# Patient Record
Sex: Male | Born: 1980 | Race: Black or African American | Hispanic: No | Marital: Single | Smoking: Former smoker
Health system: Southern US, Community
[De-identification: ages and names within clinical notes are randomized; demographics above are authoritative.]

## PROBLEM LIST (undated history)

## (undated) DIAGNOSIS — K469 Unspecified abdominal hernia without obstruction or gangrene: Secondary | ICD-10-CM

## (undated) DIAGNOSIS — A599 Trichomoniasis, unspecified: Secondary | ICD-10-CM

## (undated) DIAGNOSIS — S42302A Unspecified fracture of shaft of humerus, left arm, initial encounter for closed fracture: Secondary | ICD-10-CM

## (undated) DIAGNOSIS — G43909 Migraine, unspecified, not intractable, without status migrainosus: Secondary | ICD-10-CM

## (undated) DIAGNOSIS — E785 Hyperlipidemia, unspecified: Secondary | ICD-10-CM

## (undated) HISTORY — PX: HERNIA REPAIR: SHX51

## (undated) HISTORY — PX: OTHER SURGICAL HISTORY: SHX169

---

## 2003-09-02 ENCOUNTER — Emergency Department (HOSPITAL_COMMUNITY): Admission: EM | Admit: 2003-09-02 | Discharge: 2003-09-02 | Payer: Self-pay | Admitting: Emergency Medicine

## 2004-06-03 ENCOUNTER — Emergency Department (HOSPITAL_COMMUNITY): Admission: EM | Admit: 2004-06-03 | Discharge: 2004-06-03 | Payer: Self-pay | Admitting: Emergency Medicine

## 2004-09-27 ENCOUNTER — Emergency Department (HOSPITAL_COMMUNITY): Admission: EM | Admit: 2004-09-27 | Discharge: 2004-09-27 | Payer: Self-pay | Admitting: Emergency Medicine

## 2005-05-04 ENCOUNTER — Emergency Department (HOSPITAL_COMMUNITY): Admission: EM | Admit: 2005-05-04 | Discharge: 2005-05-04 | Payer: Self-pay | Admitting: Emergency Medicine

## 2005-10-19 ENCOUNTER — Emergency Department (HOSPITAL_COMMUNITY): Admission: EM | Admit: 2005-10-19 | Discharge: 2005-10-19 | Payer: Self-pay | Admitting: *Deleted

## 2005-11-01 ENCOUNTER — Emergency Department (HOSPITAL_COMMUNITY): Admission: EM | Admit: 2005-11-01 | Discharge: 2005-11-01 | Payer: Self-pay | Admitting: Emergency Medicine

## 2006-03-13 ENCOUNTER — Emergency Department (HOSPITAL_COMMUNITY): Admission: EM | Admit: 2006-03-13 | Discharge: 2006-03-13 | Payer: Self-pay | Admitting: Emergency Medicine

## 2006-06-01 ENCOUNTER — Emergency Department (HOSPITAL_COMMUNITY): Admission: EM | Admit: 2006-06-01 | Discharge: 2006-06-01 | Payer: Self-pay | Admitting: Emergency Medicine

## 2007-05-17 ENCOUNTER — Emergency Department (HOSPITAL_COMMUNITY): Admission: EM | Admit: 2007-05-17 | Discharge: 2007-05-17 | Payer: Self-pay | Admitting: Emergency Medicine

## 2007-07-22 ENCOUNTER — Emergency Department (HOSPITAL_COMMUNITY): Admission: EM | Admit: 2007-07-22 | Discharge: 2007-07-22 | Payer: Self-pay | Admitting: Emergency Medicine

## 2007-09-18 ENCOUNTER — Emergency Department (HOSPITAL_COMMUNITY): Admission: EM | Admit: 2007-09-18 | Discharge: 2007-09-18 | Payer: Self-pay | Admitting: Emergency Medicine

## 2007-11-14 ENCOUNTER — Emergency Department (HOSPITAL_COMMUNITY): Admission: EM | Admit: 2007-11-14 | Discharge: 2007-11-14 | Payer: Self-pay | Admitting: Emergency Medicine

## 2007-11-20 ENCOUNTER — Emergency Department (HOSPITAL_COMMUNITY): Admission: EM | Admit: 2007-11-20 | Discharge: 2007-11-20 | Payer: Self-pay | Admitting: Emergency Medicine

## 2007-12-11 ENCOUNTER — Emergency Department (HOSPITAL_COMMUNITY): Admission: EM | Admit: 2007-12-11 | Discharge: 2007-12-11 | Payer: Self-pay | Admitting: Emergency Medicine

## 2008-02-11 ENCOUNTER — Emergency Department (HOSPITAL_COMMUNITY): Admission: EM | Admit: 2008-02-11 | Discharge: 2008-02-11 | Payer: Self-pay | Admitting: Emergency Medicine

## 2008-03-26 ENCOUNTER — Emergency Department (HOSPITAL_COMMUNITY): Admission: EM | Admit: 2008-03-26 | Discharge: 2008-03-26 | Payer: Self-pay | Admitting: Emergency Medicine

## 2008-05-13 ENCOUNTER — Emergency Department (HOSPITAL_COMMUNITY): Admission: EM | Admit: 2008-05-13 | Discharge: 2008-05-13 | Payer: Self-pay | Admitting: Emergency Medicine

## 2008-06-03 ENCOUNTER — Emergency Department (HOSPITAL_COMMUNITY): Admission: EM | Admit: 2008-06-03 | Discharge: 2008-06-03 | Payer: Self-pay | Admitting: Emergency Medicine

## 2008-09-07 ENCOUNTER — Emergency Department (HOSPITAL_COMMUNITY): Admission: EM | Admit: 2008-09-07 | Discharge: 2008-09-07 | Payer: Self-pay | Admitting: Emergency Medicine

## 2008-10-04 ENCOUNTER — Emergency Department (HOSPITAL_COMMUNITY): Admission: EM | Admit: 2008-10-04 | Discharge: 2008-10-04 | Payer: Self-pay | Admitting: Emergency Medicine

## 2008-10-24 ENCOUNTER — Emergency Department (HOSPITAL_COMMUNITY): Admission: EM | Admit: 2008-10-24 | Discharge: 2008-10-24 | Payer: Self-pay | Admitting: Emergency Medicine

## 2008-11-09 ENCOUNTER — Emergency Department (HOSPITAL_COMMUNITY): Admission: EM | Admit: 2008-11-09 | Discharge: 2008-11-09 | Payer: Self-pay | Admitting: Emergency Medicine

## 2008-11-11 ENCOUNTER — Emergency Department (HOSPITAL_COMMUNITY): Admission: EM | Admit: 2008-11-11 | Discharge: 2008-11-11 | Payer: Self-pay | Admitting: Emergency Medicine

## 2008-12-01 ENCOUNTER — Emergency Department (HOSPITAL_COMMUNITY): Admission: EM | Admit: 2008-12-01 | Discharge: 2008-12-02 | Payer: Self-pay | Admitting: Emergency Medicine

## 2009-02-19 ENCOUNTER — Emergency Department (HOSPITAL_COMMUNITY): Admission: EM | Admit: 2009-02-19 | Discharge: 2009-02-19 | Payer: Self-pay | Admitting: Emergency Medicine

## 2009-02-21 ENCOUNTER — Emergency Department (HOSPITAL_COMMUNITY): Admission: EM | Admit: 2009-02-21 | Discharge: 2009-02-21 | Payer: Self-pay | Admitting: Emergency Medicine

## 2009-04-11 ENCOUNTER — Emergency Department (HOSPITAL_COMMUNITY): Admission: EM | Admit: 2009-04-11 | Discharge: 2009-04-11 | Payer: Self-pay | Admitting: Emergency Medicine

## 2010-02-22 ENCOUNTER — Encounter: Payer: Self-pay | Admitting: Emergency Medicine

## 2010-02-25 ENCOUNTER — Emergency Department (HOSPITAL_COMMUNITY)
Admission: EM | Admit: 2010-02-25 | Discharge: 2010-02-25 | Payer: Self-pay | Source: Home / Self Care | Admitting: Emergency Medicine

## 2010-04-27 LAB — BASIC METABOLIC PANEL
Creatinine, Ser: 0.74 mg/dL (ref 0.4–1.5)
GFR calc Af Amer: >60 mL/min (ref >60)
Potassium: 3.7 mEq/L (ref 3.5–5.1)
Sodium: 139 mEq/L (ref 135–145)

## 2010-04-27 LAB — DIFFERENTIAL
Basophils Relative: 0 % (ref 0–1)
Neutrophils Relative %: 75 % (ref 43–77)

## 2010-04-27 LAB — CULTURE, ROUTINE-ABSCESS

## 2010-04-27 LAB — CBC
Hemoglobin: 14.6 g/dL (ref 13.0–17.0)
MCHC: 34.4 g/dL (ref 30.0–36.0)
RBC: 4.71 MIL/uL (ref 4.22–5.81)
WBC: 9.4 10*3/uL (ref 4.0–10.5)

## 2010-05-08 LAB — DIFFERENTIAL
Basophils Absolute: 0 10*3/uL (ref 0.0–0.1)
Basophils Relative: 1 % (ref 0–1)
Eosinophils Relative: 1 % (ref 0–5)
Lymphocytes Relative: 48 % — ABNORMAL HIGH (ref 12–46)
Lymphs Abs: 3.1 10*3/uL (ref 0.7–4.0)
Monocytes Relative: 5 % (ref 3–12)
Neutro Abs: 2.9 10*3/uL (ref 1.7–7.7)
Neutrophils Relative %: 45 % (ref 43–77)

## 2010-05-08 LAB — RAPID URINE DRUG SCREEN, HOSP PERFORMED
Barbiturates: NOT DETECTED
Benzodiazepines: NOT DETECTED
Opiates: NOT DETECTED

## 2010-05-08 LAB — BASIC METABOLIC PANEL
Calcium: 9.9 mg/dL (ref 8.4–10.5)
Chloride: 110 mEq/L (ref 96–112)
GFR calc Af Amer: >60 mL/min (ref >60)
Glucose, Bld: 111 mg/dL — ABNORMAL HIGH (ref 70–99)
Potassium: 2.8 mEq/L — ABNORMAL LOW (ref 3.5–5.1)
Sodium: 143 mEq/L (ref 135–145)

## 2010-05-08 LAB — ETHANOL: Alcohol, Ethyl (B): 185 mg/dL — ABNORMAL HIGH (ref 0–10)

## 2010-05-08 LAB — CBC
MCHC: 34.4 g/dL (ref 30.0–36.0)
RDW: 13.9 % (ref 11.5–15.5)

## 2010-05-20 LAB — URINALYSIS, ROUTINE W REFLEX MICROSCOPIC
Glucose, UA: NEGATIVE mg/dL
Hgb urine dipstick: NEGATIVE
Ketones, ur: NEGATIVE mg/dL
Nitrite: NEGATIVE
Urobilinogen, UA: 1 mg/dL (ref 0.0–1.0)
pH: 6.5 (ref 5.0–8.0)

## 2010-10-30 LAB — CBC
HCT: 43.3
Hemoglobin: 14.9
MCHC: 34.5
Platelets: 263
RDW: 14.5

## 2010-10-30 LAB — ETHANOL: Alcohol, Ethyl (B): <5

## 2010-10-30 LAB — BASIC METABOLIC PANEL
BUN: 10
CO2: 28
Calcium: 8.9
Glucose, Bld: 100 — ABNORMAL HIGH

## 2010-10-30 LAB — RAPID URINE DRUG SCREEN, HOSP PERFORMED
Benzodiazepines: POSITIVE — AB
Cocaine: NOT DETECTED
Opiates: NOT DETECTED
Tetrahydrocannabinol: POSITIVE — AB

## 2010-10-30 LAB — URINALYSIS, ROUTINE W REFLEX MICROSCOPIC
Glucose, UA: NEGATIVE
Protein, ur: 100 — AB
Specific Gravity, Urine: >1.03 — ABNORMAL HIGH

## 2010-10-30 LAB — DIFFERENTIAL
Eosinophils Relative: 2
Lymphocytes Relative: 36
Neutro Abs: 3.2

## 2010-10-30 LAB — URINE MICROSCOPIC-ADD ON

## 2010-11-04 LAB — BASIC METABOLIC PANEL
BUN: 7
Calcium: 9.6
Chloride: 103
GFR calc Af Amer: >60
Potassium: 3.6

## 2010-11-04 LAB — DIFFERENTIAL
Basophils Absolute: 0
Basophils Relative: 0
Neutro Abs: 5.1
Neutrophils Relative %: 67

## 2010-11-04 LAB — STREP A DNA PROBE

## 2010-11-04 LAB — CBC
MCHC: 33.6
Platelets: 290
RDW: 12.7

## 2011-03-11 ENCOUNTER — Emergency Department (HOSPITAL_COMMUNITY): Payer: Self-pay

## 2011-03-11 ENCOUNTER — Encounter (HOSPITAL_COMMUNITY): Payer: Self-pay | Admitting: *Deleted

## 2011-03-11 ENCOUNTER — Emergency Department (HOSPITAL_COMMUNITY)
Admission: EM | Admit: 2011-03-11 | Discharge: 2011-03-11 | Disposition: A | Discharge status: Home / Self Care | Payer: Self-pay | Attending: Emergency Medicine | Admitting: Emergency Medicine

## 2011-03-11 DIAGNOSIS — F17200 Nicotine dependence, unspecified, uncomplicated: Secondary | ICD-10-CM | POA: Insufficient documentation

## 2011-03-11 DIAGNOSIS — X58XXXA Exposure to other specified factors, initial encounter: Secondary | ICD-10-CM | POA: Insufficient documentation

## 2011-03-11 DIAGNOSIS — S39012A Strain of muscle, fascia and tendon of lower back, initial encounter: Secondary | ICD-10-CM

## 2011-03-11 DIAGNOSIS — S335XXA Sprain of ligaments of lumbar spine, initial encounter: Secondary | ICD-10-CM | POA: Insufficient documentation

## 2011-03-11 DIAGNOSIS — Y9210 Unspecified residential institution as the place of occurrence of the external cause: Secondary | ICD-10-CM | POA: Insufficient documentation

## 2011-03-11 MED ORDER — HYDROCODONE-ACETAMINOPHEN 5-325 MG PO TABS
2.0000 | ORAL_TABLET | Freq: Once | ORAL | Status: AC
  Administered 2011-03-11: 2 via ORAL
  Filled 2011-03-11 (×2): qty 1

## 2011-03-11 MED ORDER — METHOCARBAMOL 500 MG PO TABS: ORAL_TABLET | ORAL | Status: DC

## 2011-03-11 MED ORDER — HYDROCODONE-ACETAMINOPHEN 5-325 MG PO TABS: 1.0000 | ORAL_TABLET | ORAL | Status: AC | PRN

## 2011-03-11 MED ORDER — ONDANSETRON HCL 4 MG PO TABS
4.0000 mg | ORAL_TABLET | Freq: Once | ORAL | Status: AC
  Administered 2011-03-11: 4 mg via ORAL
  Filled 2011-03-11: qty 1

## 2011-03-11 MED ORDER — DIAZEPAM 5 MG PO TABS
5.0000 mg | ORAL_TABLET | Freq: Once | ORAL | Status: AC
  Administered 2011-03-11: 5 mg via ORAL
  Filled 2011-03-11: qty 1

## 2011-03-11 NOTE — ED Notes (Signed)
Pt c/o lower back pain x 2 weeks but started shooting up to his neck if he bends over since Sunday. Denies injury.

## 2011-03-11 NOTE — ED Provider Notes (Signed)
History     CSN: [id number]  Arrival date & time 03/11/11  0812   First MD Initiated Contact with Patient 03/11/11 1117      Chief Complaint  Patient presents with  . Back Pain    (Consider location/radiation/quality/duration/timing/severity/associated sxs/prior treatment) HPI Comments: Pt sustained an injury to the lower back while in jail several months ago. 2 weeks ago he started having more pain, and now has pain from the back to the shoulders.  Patient is a 31 y.o. male presenting with back pain. The history is provided by the patient.  Back Pain  Pertinent negatives include no chest pain, no abdominal pain and no dysuria.    History reviewed. No pertinent past medical history.  Past Surgical History  Procedure Date  . Hernia repair     Family History  Problem Relation Age of Onset  . Hypertension Father   . Diabetes Father   . Hypertension Sister   . Diabetes Sister     History  Substance Use Topics  . Smoking status: Current Everyday Smoker    Types: Cigarettes  . Smokeless tobacco: Not on file  . Alcohol Use: Yes     occasionally      Review of Systems  Constitutional: Negative for activity change.       All ROS Neg except as noted in HPI  HENT: Negative for nosebleeds and neck pain.   Eyes: Negative for photophobia and discharge.  Respiratory: Negative for cough, shortness of breath and wheezing.   Cardiovascular: Negative for chest pain and palpitations.  Gastrointestinal: Negative for abdominal pain and blood in stool.  Genitourinary: Negative for dysuria, frequency and hematuria.  Musculoskeletal: Positive for back pain. Negative for arthralgias.  Skin: Negative.   Neurological: Negative for dizziness, seizures and speech difficulty.  Psychiatric/Behavioral: Negative for hallucinations and confusion.    Allergies  Penicillins; Bee venom; and Cranberry  Home Medications   Current Outpatient Rx  Name Route Sig Dispense Refill  .  HYDROCODONE-ACETAMINOPHEN 5-500 MG PO TABS Oral Take 1 tablet by mouth once. Patient said he took one of his dads tablets to try and help his pain      BP 133/71  Pulse 69  Temp(Src) 97.6 F (36.4 C) (Oral)  Resp 16  Ht 5\' 7"  (1.702 m)  Wt 228 lb 3 oz (103.505 kg)  BMI 35.74 kg/m2  SpO2 100%  Physical Exam  Nursing note and vitals reviewed. Constitutional: He is oriented to person, place, and time. He appears well-developed and well-nourished.  Non-toxic appearance.  HENT:  Head: Normocephalic.  Right Ear: Tympanic membrane and external ear normal.  Left Ear: Tympanic membrane and external ear normal.  Eyes: EOM and lids are normal. Pupils are equal, round, and reactive to light.  Neck: Normal range of motion. Neck supple. Carotid bruit is not present.  Cardiovascular: Normal rate, regular rhythm, normal heart sounds, intact distal pulses and normal pulses.   Pulmonary/Chest: Breath sounds normal. No respiratory distress.  Abdominal: Soft. Bowel sounds are normal. There is no tenderness. There is no guarding.  Musculoskeletal: Normal range of motion.       Lumbar area pain to palpation and ROM.  Lymphadenopathy:       Head (right side): No submandibular adenopathy present.       Head (left side): No submandibular adenopathy present.    He has no cervical adenopathy.  Neurological: He is alert and oriented to person, place, and time. He has normal strength. No cranial nerve  deficit or sensory deficit.       Gait wnl.  Skin: Skin is warm and dry.  Psychiatric: He has a normal mood and affect. His speech is normal.    ED Course  Procedures (including critical care time)  Labs Reviewed - No data to display Dg Lumbar Spine Complete  03/11/2011  *RADIOLOGY REPORT*  Clinical Data: Low back pain.  LUMBAR SPINE - COMPLETE 4+ VIEW  Comparison: None.  Findings: Five lumbar type vertebral bodies.  Vertebral body height is preserved.  The alignment is within normal limits.  Slight  straightening of the normal cervical lordosis on the lateral view. Intervertebral disc spaces are preserved.  The no pars defects are present.  IMPRESSION: No acute osseous abnormality.  Straightening of the lumbar lordosis may be secondary to spasm.  Original Report Authenticated By: Andreas Newport, M.D.     No diagnosis found.    MDM  I have reviewed nursing notes, vital signs, and all appropriate lab and imaging results for this patient.        Kathie Dike, [place] 03/11/11 2226

## 2011-03-12 NOTE — ED Provider Notes (Signed)
Medical screening examination/treatment/procedure(s) were performed by non-physician practitioner and as supervising physician I was immediately available for consultation/collaboration.   Laray Anger, DO 03/12/11 1049

## 2011-04-12 ENCOUNTER — Emergency Department (HOSPITAL_COMMUNITY)
Admission: EM | Admit: 2011-04-12 | Discharge: 2011-04-12 | Disposition: A | Discharge status: Home Health | Payer: Self-pay | Attending: Emergency Medicine | Admitting: Emergency Medicine

## 2011-04-12 ENCOUNTER — Encounter (HOSPITAL_COMMUNITY): Payer: Self-pay | Admitting: *Deleted

## 2011-04-12 DIAGNOSIS — F17200 Nicotine dependence, unspecified, uncomplicated: Secondary | ICD-10-CM | POA: Insufficient documentation

## 2011-04-12 DIAGNOSIS — K649 Unspecified hemorrhoids: Secondary | ICD-10-CM | POA: Insufficient documentation

## 2011-04-12 MED ORDER — BISACODYL 5 MG PO TBEC: 5.0000 mg | DELAYED_RELEASE_TABLET | Freq: Two times a day (BID) | ORAL | Status: AC

## 2011-04-12 MED ORDER — HYDROCORTISONE ACETATE 25 MG RE SUPP: 25.0000 mg | Freq: Two times a day (BID) | RECTAL | Status: AC

## 2011-04-12 NOTE — Discharge Instructions (Signed)
Please soak in a double warm salt water 2 times daily for approximately 15 minutes each time. After the soak please use Anusol suppositories. Please use Dulcolax to soften the stool. Please increase fluids and fiber in your diet.High Fiber Diet A high fiber diet changes your normal diet to include more whole grains, legumes, fruits, and vegetables. Changes in the diet involve replacing refined carbohydrates with unrefined foods. The calorie level of the diet is essentially unchanged. The Dietary Reference Intake (recommended amount) for adult males is 38 g per day. For adult females, it is 25 g per day. Pregnant and lactating women should consume 28 g of fiber per day. Fiber is the intact part of a plant that is not broken down during digestion. Functional fiber is fiber that has been isolated from the plant to provide a beneficial effect in the body. PURPOSE  Increase stool bulk.   Ease and regulate bowel movements.   Lower cholesterol.  INDICATIONS THAT YOU NEED MORE FIBER  Constipation and hemorrhoids.   Uncomplicated diverticulosis (intestine condition) and irritable bowel syndrome.   Weight management.   As a protective measure against hardening of the arteries (atherosclerosis), diabetes, and cancer.  NOTE OF CAUTION If you have a digestive or bowel problem, ask your caregiver for advice before adding high fiber foods to your diet. Some of the following medical problems are such that a high fiber diet should not be used without consulting your caregiver:  Acute diverticulitis (intestine infection).   Partial small bowel obstructions.   Complicated diverticular disease involving bleeding, rupture (perforation), or abscess (boil, furuncle).   Presence of autonomic neuropathy (nerve damage) or gastric paresis (stomach cannot empty itself).  GUIDELINES FOR INCREASING FIBER  Start adding fiber to the diet slowly. A gradual increase of about 5 more grams (2 slices of whole-wheat bread,  2 servings of most fruits or vegetables, or 1 bowl of high fiber cereal) per day is best. Too rapid an increase in fiber may result in constipation, flatulence, and bloating.   Drink enough water and fluids to keep your urine clear or pale yellow. Water, juice, or caffeine-free drinks are recommended. Not drinking enough fluid may cause constipation.   Eat a variety of high fiber foods rather than one type of fiber.   Try to increase your intake of fiber through using high fiber foods rather than fiber pills or supplements that contain small amounts of fiber.   The goal is to change the types of food eaten. Do not supplement your present diet with high fiber foods, but replace foods in your present diet.  INCLUDE A VARIETY OF FIBER SOURCES  Replace refined and processed grains with whole grains, canned fruits with fresh fruits, and incorporate other fiber sources. White rice, white breads, and most bakery goods contain little or no fiber.   Brown whole-grain rice, buckwheat oats, and many fruits and vegetables are all good sources of fiber. These include: broccoli, Brussels sprouts, cabbage, cauliflower, beets, sweet potatoes, white potatoes (skin on), carrots, tomatoes, eggplant, squash, berries, fresh fruits, and dried fruits.   Cereals appear to be the richest source of fiber. Cereal fiber is found in whole grains and bran. Bran is the fiber-rich outer coat of cereal grain, which is largely removed in refining. In whole-grain cereals, the bran remains. In breakfast cereals, the largest amount of fiber is found in those with "bran" in their names. The fiber content is sometimes indicated on the label.   You may need to include  additional fruits and vegetables each day.   In baking, for 1 cup white flour, you may use the following substitutions:   1 cup whole-wheat flour minus 2 tbs.    cup white flour plus  cup whole-wheat flour.  Document Released: 01/19/2005 Document Revised: 01/08/2011  Document Reviewed: 11/27/2008 [facility] Patient Information 2012 [place], [place].Hemorrhoids Hemorrhoids are enlarged (dilated) veins around the rectum. There are 2 types of hemorrhoids, and the type of hemorrhoid is determined by its location. Internal hemorrhoids occur in the veins just inside the rectum.They are usually not painful, but they may bleed.However, they may poke through to the outside and become irritated and painful. External hemorrhoids involve the veins outside the anus and can be felt as a painful swelling or hard lump near the anus.They are often itchy and may crack and bleed. Sometimes clots will form in the veins. This makes them swollen and painful. These are called thrombosed hemorrhoids. CAUSES Causes of hemorrhoids include:  Pregnancy. This increases the pressure in the hemorrhoidal veins.   Constipation.   Straining to have a bowel movement.   Obesity.   Heavy lifting or other activity that caused you to strain.  TREATMENT Most of the time hemorrhoids improve in 1 to 2 weeks. However, if symptoms do not seem to be getting better or if you have a lot of rectal bleeding, your caregiver may perform a procedure to help make the hemorrhoids get smaller or remove them completely.Possible treatments include:  Rubber band ligation. A rubber band is placed at the base of the hemorrhoid to cut off the circulation.   Sclerotherapy. A chemical is injected to shrink the hemorrhoid.   Infrared light therapy. Tools are used to burn the hemorrhoid.   Hemorrhoidectomy. This is surgical removal of the hemorrhoid.  HOME CARE INSTRUCTIONS   Increase fiber in your diet. Ask your caregiver about using fiber supplements.   Drink enough water and fluids to keep your urine clear or pale yellow.   Exercise regularly.   Go to the bathroom when you have the urge to have a bowel movement. Do not wait.   Avoid straining to have bowel movements.   Keep the anal area dry and  clean.   Only take over-the-counter or prescription medicines for pain, discomfort, or fever as directed by your caregiver.  If your hemorrhoids are thrombosed:  Take warm sitz baths for 20 to 30 minutes, 3 to 4 times per day.   If the hemorrhoids are very tender and swollen, place ice packs on the area as tolerated. Using ice packs between sitz baths may be helpful. Fill a plastic bag with ice. Place a towel between the bag of ice and your skin.   Medicated creams and suppositories may be used or applied as directed.   Do not use a donut-shaped pillow or sit on the toilet for long periods. This increases blood pooling and pain.  SEEK MEDICAL CARE IF:   You have increasing pain and swelling that is not controlled with your medicine.   You have uncontrolled bleeding.   You have difficulty or you are unable to have a bowel movement.   You have pain or inflammation outside the area of the hemorrhoids.   You have chills or an oral temperature above 102 F (38.9 C).  MAKE SURE YOU:   Understand these instructions.   Will watch your condition.   Will get help right away if you are not doing well or get worse.  Document Released: 01/17/2000  Document Revised: 01/08/2011 Document Reviewed: 05/24/2007 [facility] Patient Information 2012 [place].

## 2011-04-12 NOTE — ED Provider Notes (Signed)
Medical screening examination/treatment/procedure(s) were performed by non-physician practitioner and as supervising physician I was immediately available for consultation/collaboration. Adelfo Diebel, MD, FACEP   Kathy Wahid L Symphoni Helbling, MD 04/12/11 1630 

## 2011-04-12 NOTE — ED Provider Notes (Signed)
History     CSN: [id number]  Arrival date & time 04/12/11  1224   First MD Initiated Contact with Patient 04/12/11 1256      Chief Complaint  Patient presents with  . Rectal Pain    (Consider location/radiation/quality/duration/timing/severity/associated sxs/prior treatment) HPI Comments: Patient presents with approximately 4 days of constipation. It is of note that he has been on narcotic pain medication and muscle relaxant medication recently. Patient now feels like he has a" knot on the side of his rectum". He is concerned that this may be something other than a hemorrhoid and presents to the emergency department for a period he has not taken any medications for this problem. He has not seen any other physicians or healthcare professionals prior to this visit for this problem.  The history is provided by the patient.    History reviewed. No pertinent past medical history.  Past Surgical History  Procedure Date  . Hernia repair     Family History  Problem Relation Age of Onset  . Hypertension Father   . Diabetes Father   . Hypertension Sister   . Diabetes Sister     History  Substance Use Topics  . Smoking status: Current Everyday Smoker -- 0.5 packs/day    Types: Cigarettes  . Smokeless tobacco: Not on file  . Alcohol Use: Yes     occasionally      Review of Systems  Constitutional: Negative for activity change.       All ROS Neg except as noted in HPI  HENT: Negative for nosebleeds and neck pain.   Eyes: Negative for photophobia and discharge.  Respiratory: Negative for cough, shortness of breath and wheezing.   Cardiovascular: Negative for chest pain and palpitations.  Gastrointestinal: Positive for rectal pain. Negative for abdominal pain and blood in stool.  Genitourinary: Negative for dysuria, frequency and hematuria.  Musculoskeletal: Negative for back pain and arthralgias.  Skin: Negative.   Neurological: Negative for dizziness, seizures and speech  difficulty.  Psychiatric/Behavioral: Negative for hallucinations and confusion.    Allergies  Penicillins; Bee venom; and Cranberry  Home Medications   Current Outpatient Rx  Name Route Sig Dispense Refill  . HYDROCODONE-ACETAMINOPHEN 5-500 MG PO TABS Oral Take 1 tablet by mouth once. Patient said he took one of his dads tablets to try and help his pain    . METHOCARBAMOL 500 MG PO TABS  2 po tid for spasm 30 tablet 0    BP 139/89  Pulse 70  Temp(Src) 97.6 F (36.4 C) (Oral)  Resp 20  Ht 5\' 7"  (1.702 m)  Wt 228 lb (103.42 kg)  BMI 35.71 kg/m2  SpO2 100%  Physical Exam  Nursing note and vitals reviewed. Constitutional: He is oriented to person, place, and time. He appears well-developed and well-nourished.  Non-toxic appearance.  HENT:  Head: Normocephalic.  Right Ear: Tympanic membrane and external ear normal.  Left Ear: Tympanic membrane and external ear normal.  Eyes: EOM and lids are normal. Pupils are equal, round, and reactive to light.  Neck: Normal range of motion. Neck supple. Carotid bruit is not present.  Cardiovascular: Normal rate, regular rhythm, normal heart sounds, intact distal pulses and normal pulses.   Pulmonary/Chest: Breath sounds normal. No respiratory distress.  Abdominal: Soft. Bowel sounds are normal. There is no tenderness. There is no guarding.  Genitourinary:       Small tender hemorrhoid at the 9:00 position of the rectal area. There is no fissure present. There is  no abscess area. Good sphincter tone.  Musculoskeletal: Normal range of motion.  Lymphadenopathy:       Head (right side): No submandibular adenopathy present.       Head (left side): No submandibular adenopathy present.    He has no cervical adenopathy.  Neurological: He is alert and oriented to person, place, and time. He has normal strength. No cranial nerve deficit or sensory deficit.  Skin: Skin is warm and dry.  Psychiatric: He has a normal mood and affect. His speech is  normal.    ED Course  Procedures (including critical care time)  Labs Reviewed - No data to display No results found.   No diagnosis found.    MDM  I have reviewed nursing notes, vital signs, and all appropriate lab and imaging results for this patient. Patient has been using narcotic pain medication and muscle relaxant medication recently. He now presents with 4 days of constipation and most recently a" knot of the side of the rectal area. The examination is consistent with hemorrhoids. The patient will use warm tub soaks 2 times daily, Anusol suppositories 2 times daily, and Dulcolax suppository for stool softener.`       Kathie Dike, [place] 04/12/11 1348

## 2011-04-12 NOTE — ED Notes (Signed)
Pt DC to home with steady gait 

## 2011-04-12 NOTE — ED Notes (Signed)
Pt presents to the Ed with c/o irregular bowel movements, constipation and rectal pain." Feels like a knot on side of rectal area".

## 2011-05-24 ENCOUNTER — Encounter (HOSPITAL_COMMUNITY): Payer: Self-pay

## 2011-05-24 ENCOUNTER — Emergency Department (HOSPITAL_COMMUNITY)
Admission: EM | Admit: 2011-05-24 | Discharge: 2011-05-24 | Disposition: A | Discharge status: Home / Self Care | Payer: Self-pay | Attending: Emergency Medicine | Admitting: Emergency Medicine

## 2011-05-24 DIAGNOSIS — F17200 Nicotine dependence, unspecified, uncomplicated: Secondary | ICD-10-CM | POA: Insufficient documentation

## 2011-05-24 DIAGNOSIS — R51 Headache: Secondary | ICD-10-CM | POA: Insufficient documentation

## 2011-05-24 DIAGNOSIS — K029 Dental caries, unspecified: Secondary | ICD-10-CM | POA: Insufficient documentation

## 2011-05-24 DIAGNOSIS — K089 Disorder of teeth and supporting structures, unspecified: Secondary | ICD-10-CM | POA: Insufficient documentation

## 2011-05-24 DIAGNOSIS — K0889 Other specified disorders of teeth and supporting structures: Secondary | ICD-10-CM

## 2011-05-24 MED ORDER — OXYCODONE-ACETAMINOPHEN 5-325 MG PO TABS: 1.0000 | ORAL_TABLET | ORAL | Status: AC | PRN

## 2011-05-24 MED ORDER — METRONIDAZOLE 500 MG PO TABS: 500.0000 mg | ORAL_TABLET | Freq: Two times a day (BID) | ORAL | Status: AC

## 2011-05-24 NOTE — ED Notes (Signed)
Toothache to left side of mouth

## 2011-05-24 NOTE — ED Notes (Signed)
Pt presents with lower left molar dental pain x 3 days. Minimal swelling noted. Pt reports has dental appointment later in week.

## 2011-05-24 NOTE — ED Notes (Signed)
Patient called out saying they haven't seen a doctor since they have been in the room. Patient was advised the doctor would be in shortly.

## 2011-05-24 NOTE — ED Provider Notes (Signed)
History     CSN: [id number]  Arrival date & time 05/24/11  1308   First MD Initiated Contact with Patient 05/24/11 1457      Chief Complaint  Patient presents with  . Dental Pain    (Consider location/radiation/quality/duration/timing/severity/associated sxs/prior treatment) HPI Miguel Robbins is a 31 y.o. male who presents to the ED for dental pain. He states that he got up today and ate then brushed his teeth and began feeling pain in the bottom left side of his mouth. He rates the pain as 8/10. He has not taken anything for pain. He has had dental problems in the past. [facility]. He denies any other problems today. The history was provided by the patient.  History reviewed. No pertinent past medical history.  Past Surgical History  Procedure Date  . Hernia repair     Family History  Problem Relation Age of Onset  . Hypertension Father   . Diabetes Father   . Hypertension Sister   . Diabetes Sister     History  Substance Use Topics  . Smoking status: Current Everyday Smoker -- 0.5 packs/day    Types: Cigarettes  . Smokeless tobacco: Not on file  . Alcohol Use: Yes     occasionally      Review of Systems  Constitutional: Negative for fever and chills.  HENT: Positive for dental problem. Negative for ear pain, congestion, facial swelling and neck pain.   Eyes: Negative.   Respiratory: Negative for cough and wheezing.   Cardiovascular: Negative for chest pain and palpitations.  Gastrointestinal: Negative for nausea, vomiting and abdominal pain.  Genitourinary: Negative for dysuria and frequency.  Musculoskeletal: Negative for back pain.  Skin: Negative.   Neurological: Positive for headaches. Negative for light-headedness.  Psychiatric/Behavioral: Negative for confusion and agitation. The patient is not nervous/anxious.     Allergies  Penicillins; Bee venom; and Cranberry  Home Medications   Current  Outpatient Rx  Name Route Sig Dispense Refill  . HYDROCODONE-ACETAMINOPHEN 5-500 MG PO TABS Oral Take 1 tablet by mouth once. Patient said he took one of his dads tablets to try and help his pain    . METHOCARBAMOL 500 MG PO TABS  2 po tid for spasm 30 tablet 0    BP 126/79  Pulse 67  Temp(Src) 97.9 F (36.6 C) (Oral)  Resp 20  Ht 5' 7.75" (1.721 m)  Wt 222 lb (100.699 kg)  BMI 34.00 kg/m2  SpO2 100%  Physical Exam  Nursing note and vitals reviewed. Constitutional: He is oriented to person, place, and time. He appears well-developed and well-nourished.  HENT:  Head: Normocephalic.  Right Ear: Tympanic membrane, external ear and ear canal normal.  Left Ear: Tympanic membrane, external ear and ear canal normal.  Mouth/Throat: Uvula is midline and oropharynx is clear and moist. Dental caries present.    Eyes: EOM are normal.  Neck: Neck supple.  Cardiovascular: Normal rate.   Pulmonary/Chest: Effort normal.  Abdominal: Soft. There is no tenderness.  Musculoskeletal: Normal range of motion.  Lymphadenopathy:    He has no cervical adenopathy.  Neurological: He is alert and oriented to person, place, and time. No cranial nerve deficit.  Skin: Skin is warm and dry.  Psychiatric: He has a normal mood and affect. His behavior is normal. Judgment and thought content normal.   Assessment: Dental pain   Dental caries  Plan:  Percocet Rx   Flagyl Rx (  patient allergic to PCN)   Keep appointment at the [facility] this week     ED Course I have reviewed this patient's vital signs, nurses notes, appropriate labs and imaging.   Procedures  MDM          Janne Napoleon, NP 05/25/11 [phone]

## 2011-05-24 NOTE — Discharge Instructions (Signed)
Keep your appointment this week with the dental clinic.  Dental Pain A tooth ache may be caused by cavities (tooth decay). Cavities expose the nerve of the tooth to air and hot or cold temperatures. It may come from an infection or abscess (also called a boil or furuncle) around your tooth. It is also often caused by dental caries (tooth decay). This causes the pain you are having. DIAGNOSIS  Your caregiver can diagnose this problem by exam. TREATMENT   If caused by an infection, it may be treated with medications which kill germs (antibiotics) and pain medications as prescribed by your caregiver. Take medications as directed.   Only take over-the-counter or prescription medicines for pain, discomfort, or fever as directed by your caregiver.   Whether the tooth ache today is caused by infection or dental disease, you should see your dentist as soon as possible for further care.  SEEK MEDICAL CARE IF: The exam and treatment you received today has been provided on an emergency basis only. This is not a substitute for complete medical or dental care. If your problem worsens or new problems (symptoms) appear, and you are unable to meet with your dentist, call or return to this location. SEEK IMMEDIATE MEDICAL CARE IF:   You have a fever.   You develop redness and swelling of your face, jaw, or neck.   You are unable to open your mouth.   You have severe pain uncontrolled by pain medicine.  MAKE SURE YOU:   Understand these instructions.   Will watch your condition.   Will get help right away if you are not doing well or get worse.  Document Released: 01/19/2005 Document Revised: 01/08/2011 Document Reviewed: 09/07/2007 [facility] Patient Information 2012 [place], [place].

## 2011-05-25 NOTE — ED Provider Notes (Signed)
Medical screening examination/treatment/procedure(s) were performed by non-physician practitioner and as supervising physician I was immediately available for consultation/collaboration.  Kennis Wissmann S. Finola Rosal, MD 05/25/11 2013 

## 2011-06-22 ENCOUNTER — Encounter (HOSPITAL_COMMUNITY): Payer: Self-pay | Admitting: *Deleted

## 2011-06-22 ENCOUNTER — Emergency Department (HOSPITAL_COMMUNITY)
Admission: EM | Admit: 2011-06-22 | Discharge: 2011-06-22 | Disposition: A | Discharge status: Home / Self Care | Payer: Self-pay | Attending: Emergency Medicine | Admitting: Emergency Medicine

## 2011-06-22 DIAGNOSIS — S39012A Strain of muscle, fascia and tendon of lower back, initial encounter: Secondary | ICD-10-CM

## 2011-06-22 DIAGNOSIS — S335XXA Sprain of ligaments of lumbar spine, initial encounter: Secondary | ICD-10-CM | POA: Insufficient documentation

## 2011-06-22 DIAGNOSIS — F17200 Nicotine dependence, unspecified, uncomplicated: Secondary | ICD-10-CM | POA: Insufficient documentation

## 2011-06-22 DIAGNOSIS — W108XXA Fall (on) (from) other stairs and steps, initial encounter: Secondary | ICD-10-CM | POA: Insufficient documentation

## 2011-06-22 MED ORDER — CYCLOBENZAPRINE HCL 10 MG PO TABS
10.0000 mg | ORAL_TABLET | Freq: Once | ORAL | Status: AC
  Administered 2011-06-22: 10 mg via ORAL
  Filled 2011-06-22: qty 1

## 2011-06-22 MED ORDER — IBUPROFEN 800 MG PO TABS
800.0000 mg | ORAL_TABLET | Freq: Once | ORAL | Status: AC
  Administered 2011-06-22: 800 mg via ORAL
  Filled 2011-06-22: qty 1

## 2011-06-22 MED ORDER — CYCLOBENZAPRINE HCL 10 MG PO TABS: ORAL_TABLET | ORAL | Status: DC

## 2011-06-22 NOTE — Discharge Instructions (Signed)
Back Pain, Adult Low back pain is very common. About 1 in 5 people have back pain.The cause of low back pain is rarely dangerous. The pain often gets better over time.About half of people with a sudden onset of back pain feel better in just 2 weeks. About 8 in 10 people feel better by 6 weeks.  CAUSES Some common causes of back pain include:  Strain of the muscles or ligaments supporting the spine.   Wear and tear (degeneration) of the spinal discs.   Arthritis.   Direct injury to the back.  DIAGNOSIS Most of the time, the direct cause of low back pain is not known.However, back pain can be treated effectively even when the exact cause of the pain is unknown.Answering your caregiver's questions about your overall health and symptoms is one of the most accurate ways to make sure the cause of your pain is not dangerous. If your caregiver needs more information, he or she may order lab work or imaging tests (X-rays or MRIs).However, even if imaging tests show changes in your back, this usually does not require surgery. HOME CARE INSTRUCTIONS For many people, back pain returns.Since low back pain is rarely dangerous, it is often a condition that people can learn to manageon their own.   Remain active. It is stressful on the back to sit or stand in one place. Do not sit, drive, or stand in one place for more than 30 minutes at a time. Take short walks on level surfaces as soon as pain allows.Try to increase the length of time you walk each day.   Do not stay in bed.Resting more than 1 or 2 days can delay your recovery.   Do not avoid exercise or work.Your body is made to move.It is not dangerous to be active, even though your back may hurt.Your back will likely heal faster if you return to being active before your pain is gone.   Pay attention to your body when you bend and lift. Many people have less discomfortwhen lifting if they bend their knees, keep the load close to their  bodies,and avoid twisting. Often, the most comfortable positions are those that put less stress on your recovering back.   Find a comfortable position to sleep. Use a firm mattress and lie on your side with your knees slightly bent. If you lie on your back, put a pillow under your knees.   Only take over-the-counter or prescription medicines as directed by your caregiver. Over-the-counter medicines to reduce pain and inflammation are often the most helpful.Your caregiver may prescribe muscle relaxant drugs.These medicines help dull your pain so you can more quickly return to your normal activities and healthy exercise.   Put ice on the injured area.   Put ice in a plastic bag.   Place a towel between your skin and the bag.   Leave the ice on for 15 to 20 minutes, 3 to 4 times a day for the first 2 to 3 days. After that, ice and heat may be alternated to reduce pain and spasms.   Ask your caregiver about trying back exercises and gentle massage. This may be of some benefit.   Avoid feeling anxious or stressed.Stress increases muscle tension and can worsen back pain.It is important to recognize when you are anxious or stressed and learn ways to manage it.Exercise is a great option.  SEEK MEDICAL CARE IF:  You have pain that is not relieved with rest or medicine.   You have   pain that does not improve in 1 week.   You have new symptoms.   You are generally not feeling well.  SEEK IMMEDIATE MEDICAL CARE IF:   You have pain that radiates from your back into your legs.   You develop new bowel or bladder control problems.   You have unusual weakness or numbness in your arms or legs.   You develop nausea or vomiting.   You develop abdominal pain.   You feel faint.  Document Released: 01/19/2005 Document Revised: 01/08/2011 Document Reviewed: 06/09/2010 [facility] Patient Information 2012 [place], [place].Muscle Strain A muscle strain, or pulled muscle, occurs when a muscle is  over-stretched. A small number of muscle fibers may also be torn. This is especially common in athletes. This happens when a sudden violent force placed on a muscle pushes it past its capacity. Usually, recovery from a pulled muscle takes 1 to 2 weeks. But complete healing will take 5 to 6 weeks. There are millions of muscle fibers. Following injury, your body will usually return to normal quickly. HOME CARE INSTRUCTIONS   While awake, apply ice to the sore muscle for 15 to 20 minutes each hour for the first 2 days. Put ice in a plastic bag and place a towel between the bag of ice and your skin.   Do not use the pulled muscle for several days. Do not use the muscle if you have pain.   You may wrap the injured area with an elastic bandage for comfort. Be careful not to bind it too tightly. This may interfere with blood circulation.   Only take over-the-counter or prescription medicines for pain, discomfort, or fever as directed by your caregiver. Do not use aspirin as this will increase bleeding (bruising) at injury site.   Warming up before exercise helps prevent muscle strains.  SEEK MEDICAL CARE IF:  There is increased pain or swelling in the affected area. MAKE SURE YOU:   Understand these instructions.   Will watch your condition.   Will get help right away if you are not doing well or get worse.  Document Released: 01/19/2005 Document Revised: 01/08/2011 Document Reviewed: 08/18/2006 [facility] Patient Information 2012 [place], [place].   Take the flexeril as directed.  Take ibuprofen 800 mg every 8 hrs with food.  Apply ice several times daily.  Follow up with dr. Hilda Lias as needed.

## 2011-06-22 NOTE — ED Notes (Signed)
C/o left shoulder pain x 2 days.  Reports fell down steps yesterday, woke up this morning with lower back pain radiating to left shoulder and to left side of neck.

## 2011-06-22 NOTE — ED Provider Notes (Signed)
History     CSN: [id number]  Arrival date & time 06/22/11  0830   First MD Initiated Contact with Patient 06/22/11 [phone]      Chief Complaint  Patient presents with  . Back Pain    (Consider location/radiation/quality/duration/timing/severity/associated sxs/prior treatment) HPI Comments: Playing with nephew yest and rolled down 3-4 front steps.  Had no pain yest but awakened with achiness today.  Patient is a 31 y.o. male presenting with back pain. The history is provided by the patient. No language interpreter was used.  Back Pain  This is a new problem. Episode onset: this AM. Episode frequency: with movement. The problem has not changed since onset.The quality of the pain is described as aching. The pain does not radiate. The pain is moderate. Pertinent negatives include no fever, no numbness and no weakness. He has tried nothing for the symptoms.    History reviewed. No pertinent past medical history.  Past Surgical History  Procedure Date  . Hernia repair     Family History  Problem Relation Age of Onset  . Hypertension Father   . Diabetes Father   . Hypertension Sister   . Diabetes Sister     History  Substance Use Topics  . Smoking status: Current Everyday Smoker -- 0.5 packs/day    Types: Cigarettes  . Smokeless tobacco: Not on file  . Alcohol Use: Yes     occasionally      Review of Systems  Constitutional: Negative for fever.  Musculoskeletal: Positive for back pain.  Neurological: Negative for weakness and numbness.  All other systems reviewed and are negative.    Allergies  Penicillins; Bee venom; and Cranberry  Home Medications   Current Outpatient Rx  Name Route Sig Dispense Refill  . CYCLOBENZAPRINE HCL 10 MG PO TABS  1/2 to one tab po TID 21 tablet 0    BP 136/85  Pulse 65  Temp(Src) 98.3 F (36.8 C) (Oral)  Resp 16  Ht 5\' 7"  (1.702 m)  Wt 226 lb (102.513 kg)  BMI 35.40 kg/m2  SpO2 100%  Physical Exam  Nursing note and vitals  reviewed. Constitutional: He is oriented to person, place, and time. He appears well-developed and well-nourished.  HENT:  Head: Normocephalic and atraumatic.  Eyes: EOM are normal.  Neck: Normal range of motion.  Cardiovascular: Normal rate, regular rhythm, normal heart sounds and intact distal pulses.   Pulmonary/Chest: Effort normal and breath sounds normal. No respiratory distress.  Abdominal: Soft. He exhibits no distension. There is no tenderness.  Musculoskeletal: Normal range of motion. He exhibits no tenderness.       No bony or muscle PT in lumbar area which is where pt localizes pain with movement.  Neurological: He is alert and oriented to person, place, and time. GCS eye subscore is 4. GCS verbal subscore is 5. GCS motor subscore is 6.  Reflex Scores:      Patellar reflexes are 2+ on the right side and 2+ on the left side.      Achilles reflexes are 2+ on the right side and 2+ on the left side. Skin: Skin is warm and dry.  Psychiatric: He has a normal mood and affect. Judgment normal.    ED Course  Procedures (including critical care time)  Labs Reviewed - No data to display No results found.   1. Lumbar strain       MDM  Muscle strain vs contusion.. Ice, ibuprofen 800 mg TID and rx-flexeril 10 mg TID.  F/u with dr. Hilda Lias prn.        Worthy Rancher, PA 06/22/11 1049

## 2011-06-22 NOTE — ED Notes (Signed)
Pt states lower back pain with pain radiating to left shoulder. States pain was radiating to left leg this morning. Pt states he fell down four steps yesterday. NAD.

## 2011-06-24 NOTE — ED Provider Notes (Signed)
Medical screening examination/treatment/procedure(s) were performed by non-physician practitioner and as supervising physician I was immediately available for consultation/collaboration.  Jamonte Curfman, MD 06/24/11 0907 

## 2011-07-10 ENCOUNTER — Emergency Department (HOSPITAL_COMMUNITY)
Admission: EM | Admit: 2011-07-10 | Discharge: 2011-07-10 | Disposition: A | Discharge status: Home / Self Care | Payer: Self-pay | Attending: Emergency Medicine | Admitting: Emergency Medicine

## 2011-07-10 ENCOUNTER — Encounter (HOSPITAL_COMMUNITY): Payer: Self-pay | Admitting: *Deleted

## 2011-07-10 DIAGNOSIS — K089 Disorder of teeth and supporting structures, unspecified: Secondary | ICD-10-CM | POA: Insufficient documentation

## 2011-07-10 DIAGNOSIS — K0889 Other specified disorders of teeth and supporting structures: Secondary | ICD-10-CM

## 2011-07-10 MED ORDER — CLINDAMYCIN HCL 150 MG PO CAPS
300.0000 mg | ORAL_CAPSULE | Freq: Once | ORAL | Status: AC
  Administered 2011-07-10: 300 mg via ORAL
  Filled 2011-07-10: qty 2

## 2011-07-10 MED ORDER — IBUPROFEN 800 MG PO TABS
800.0000 mg | ORAL_TABLET | Freq: Once | ORAL | Status: AC
  Administered 2011-07-10: 800 mg via ORAL
  Filled 2011-07-10: qty 1

## 2011-07-10 MED ORDER — HYDROCODONE-ACETAMINOPHEN 5-325 MG PO TABS
1.0000 | ORAL_TABLET | Freq: Once | ORAL | Status: AC
  Administered 2011-07-10: 1 via ORAL
  Filled 2011-07-10: qty 1

## 2011-07-10 MED ORDER — CLINDAMYCIN HCL 150 MG PO CAPS: 300.0000 mg | ORAL_CAPSULE | Freq: Three times a day (TID) | ORAL | Status: AC

## 2011-07-10 MED ORDER — HYDROCODONE-ACETAMINOPHEN 5-325 MG PO TABS: ORAL_TABLET | ORAL | Status: DC

## 2011-07-10 NOTE — Discharge Instructions (Signed)
Dental Pain A tooth ache may be caused by cavities (tooth decay). Cavities expose the nerve of the tooth to air and hot or cold temperatures. It may come from an infection or abscess (also called a boil or furuncle) around your tooth. It is also often caused by dental caries (tooth decay). This causes the pain you are having. DIAGNOSIS  Your caregiver can diagnose this problem by exam. TREATMENT   If caused by an infection, it may be treated with medications which kill germs (antibiotics) and pain medications as prescribed by your caregiver. Take medications as directed.   Only take over-the-counter or prescription medicines for pain, discomfort, or fever as directed by your caregiver.   Whether the tooth ache today is caused by infection or dental disease, you should see your dentist as soon as possible for further care.  SEEK MEDICAL CARE IF: The exam and treatment you received today has been provided on an emergency basis only. This is not a substitute for complete medical or dental care. If your problem worsens or new problems (symptoms) appear, and you are unable to meet with your dentist, call or return to this location. SEEK IMMEDIATE MEDICAL CARE IF:   You have a fever.   You develop redness and swelling of your face, jaw, or neck.   You are unable to open your mouth.   You have severe pain uncontrolled by pain medicine.  MAKE SURE YOU:   Understand these instructions.   Will watch your condition.   Will get help right away if you are not doing well or get worse.  Document Released: 01/19/2005 Document Revised: 01/08/2011 Document Reviewed: 09/07/2007 [employer] Patient Information 2012 [employer].   Take the meds as directed.  Take ibuprofen 800 mg every 8 hrs with food.  Follow up with the dentist of your choice 

## 2011-07-10 NOTE — ED Notes (Signed)
Pt states that he woke up with right upper tooth ache,

## 2011-07-10 NOTE — ED Provider Notes (Signed)
History     CSN: [id number]  Arrival date & time 07/10/11  5621   First MD Initiated Contact with Patient 07/10/11 1009      Chief Complaint  Patient presents with  . Dental Pain    (Consider location/radiation/quality/duration/timing/severity/associated sxs/prior treatment) Patient is a 31 y.o. male presenting with tooth pain. The history is provided by the patient. No language interpreter was used.  Dental PainThe primary symptoms include mouth pain. Primary symptoms do not include dental injury or fever. Episode onset: awakened this AM with R upper molar pain. The symptoms are unchanged. The symptoms occur constantly.  Additional symptoms do not include: purulent gums, trismus, jaw pain, facial swelling and swollen glands.    History reviewed. No pertinent past medical history.  Past Surgical History  Procedure Date  . Hernia repair     Family History  Problem Relation Age of Onset  . Hypertension Father   . Diabetes Father   . Hypertension Sister   . Diabetes Sister     History  Substance Use Topics  . Smoking status: Current Everyday Smoker -- 0.5 packs/day    Types: Cigarettes  . Smokeless tobacco: Not on file  . Alcohol Use: Yes     occasionally      Review of Systems  Constitutional: Negative for fever.  HENT: Positive for dental problem. Negative for facial swelling.     Allergies  Penicillins; Bee venom; and Cranberry  Home Medications   Current Outpatient Rx  Name Route Sig Dispense Refill  . CLINDAMYCIN HCL 150 MG PO CAPS Oral Take 2 capsules (300 mg total) by mouth 3 (three) times daily. 60 capsule 0  . HYDROCODONE-ACETAMINOPHEN 5-325 MG PO TABS  One tab po q 4-6 hrs prn pain 20 tablet 0    BP 140/82  Pulse 61  Temp 98.4 F (36.9 C)  Resp 18  Ht 5\' 7"  (1.702 m)  Wt 220 lb 1 oz (99.82 kg)  BMI 34.47 kg/m2  SpO2 99%  Physical Exam  Nursing note and vitals reviewed. Constitutional: He is oriented to person, place, and time. He appears  well-developed and well-nourished.  HENT:  Head: Normocephalic and atraumatic.  Mouth/Throat: Uvula is midline, oropharynx is clear and moist and mucous membranes are normal. Dental caries present. No dental abscesses or uvula swelling.    Eyes: EOM are normal.  Neck: Normal range of motion.  Cardiovascular: Normal rate, regular rhythm, normal heart sounds and intact distal pulses.   Pulmonary/Chest: Effort normal and breath sounds normal. No respiratory distress.  Abdominal: Soft. He exhibits no distension. There is no tenderness.  Musculoskeletal: Normal range of motion.  Lymphadenopathy:    He has no cervical adenopathy.  Neurological: He is alert and oriented to person, place, and time.  Skin: Skin is warm and dry.  Psychiatric: He has a normal mood and affect. Judgment normal.    ED Course  Procedures (including critical care time)  Labs Reviewed - No data to display No results found.   1. Pain, dental       MDM  no obvious dental abscess. rx-clindamycin 300 mg TID, 30 rx-hydrocodone, 20 OTC ibuprofen 800 mg TID F/u with dentist of choice.         Worthy Rancher, PA 07/19/11 [phone]

## 2011-07-21 NOTE — ED Provider Notes (Signed)
Medical screening examination/treatment/procedure(s) were performed by non-physician practitioner and as supervising physician I was immediately available for consultation/collaboration.  Devoria Albe, MD, Armando Gang  Ward Givens, MD 07/21/11 [phone]

## 2011-07-23 ENCOUNTER — Emergency Department (HOSPITAL_COMMUNITY)
Admission: EM | Admit: 2011-07-23 | Discharge: 2011-07-23 | Disposition: A | Discharge status: Home / Self Care | Payer: Self-pay | Attending: Emergency Medicine | Admitting: Emergency Medicine

## 2011-07-23 ENCOUNTER — Encounter (HOSPITAL_COMMUNITY): Payer: Self-pay

## 2011-07-23 DIAGNOSIS — F17200 Nicotine dependence, unspecified, uncomplicated: Secondary | ICD-10-CM | POA: Insufficient documentation

## 2011-07-23 DIAGNOSIS — K0889 Other specified disorders of teeth and supporting structures: Secondary | ICD-10-CM

## 2011-07-23 DIAGNOSIS — K089 Disorder of teeth and supporting structures, unspecified: Secondary | ICD-10-CM | POA: Insufficient documentation

## 2011-07-23 MED ORDER — HYDROCODONE-ACETAMINOPHEN 5-325 MG PO TABS: 1.0000 | ORAL_TABLET | Freq: Four times a day (QID) | ORAL | Status: AC | PRN

## 2011-07-23 MED ORDER — CLINDAMYCIN HCL 150 MG PO CAPS: ORAL_CAPSULE | ORAL | Status: DC

## 2011-07-23 MED ORDER — HYDROCODONE-ACETAMINOPHEN 5-325 MG PO TABS
1.0000 | ORAL_TABLET | Freq: Once | ORAL | Status: AC
  Administered 2011-07-23: 1 via ORAL
  Filled 2011-07-23: qty 1

## 2011-07-23 MED ORDER — IBUPROFEN 800 MG PO TABS
800.0000 mg | ORAL_TABLET | Freq: Once | ORAL | Status: AC
  Administered 2011-07-23: 800 mg via ORAL
  Filled 2011-07-23: qty 1

## 2011-07-23 MED ORDER — CLINDAMYCIN HCL 150 MG PO CAPS
300.0000 mg | ORAL_CAPSULE | Freq: Once | ORAL | Status: AC
  Administered 2011-07-23: 300 mg via ORAL
  Filled 2011-07-23: qty 2

## 2011-07-23 NOTE — ED Provider Notes (Signed)
History     CSN: [id number]  Arrival date & time 07/23/11  1348   First MD Initiated Contact with Patient 07/23/11 1417      Chief Complaint  Patient presents with  . Dental Pain    (Consider location/radiation/quality/duration/timing/severity/associated sxs/prior treatment) HPI Comments: States he was here ~ 2 weeks ago b/c the same two teeth were hurting him then.  States he has culled multiple dentists and "they want $150 to pull a tooth".    Patient is a 31 y.o. male presenting with tooth pain. The history is provided by the patient. No language interpreter was used.  Dental PainThe primary symptoms include mouth pain. Primary symptoms do not include dental injury or fever. Episode onset: several weeks ago. The symptoms are unchanged.  Additional symptoms do not include: purulent gums, trismus and facial swelling.    History reviewed. No pertinent past medical history.  Past Surgical History  Procedure Date  . Hernia repair     Family History  Problem Relation Age of Onset  . Hypertension Father   . Diabetes Father   . Hypertension Sister   . Diabetes Sister     History  Substance Use Topics  . Smoking status: Current Everyday Smoker -- 0.5 packs/day    Types: Cigarettes  . Smokeless tobacco: Not on file  . Alcohol Use: Yes     occasionally      Review of Systems  Constitutional: Negative for fever.  HENT: Positive for dental problem. Negative for facial swelling.   Cardiovascular: Negative for chest pain.  All other systems reviewed and are negative.    Allergies  Penicillins; Bee venom; and Cranberry  Home Medications   Current Outpatient Rx  Name Route Sig Dispense Refill  . CLINDAMYCIN HCL 150 MG PO CAPS  2 tabs po TID 60 capsule 0  . HYDROCODONE-ACETAMINOPHEN 5-325 MG PO TABS  One tab po q 4-6 hrs prn pain 20 tablet 0  . HYDROCODONE-ACETAMINOPHEN 5-325 MG PO TABS Oral Take 1 tablet by mouth every 6 (six) hours as needed for pain. 20 tablet 0      BP 119/74  Pulse 75  Temp 98.3 F (36.8 C) (Oral)  Resp 18  Ht 5\' 7"  (1.702 m)  Wt 220 lb (99.791 kg)  BMI 34.46 kg/m2  SpO2 100%  Physical Exam  Nursing note and vitals reviewed. Constitutional: He is oriented to person, place, and time. He appears well-developed and well-nourished.  HENT:  Head: Normocephalic and atraumatic. No trismus in the jaw.  Mouth/Throat: Uvula is midline and mucous membranes are normal. No dental abscesses, uvula swelling or dental caries. No oropharyngeal exudate, posterior oropharyngeal edema, posterior oropharyngeal erythema or tonsillar abscesses.    Eyes: EOM are normal.  Neck: Normal range of motion.  Cardiovascular: Normal rate, regular rhythm, normal heart sounds and intact distal pulses.   Pulmonary/Chest: Effort normal and breath sounds normal. No respiratory distress.  Abdominal: Soft. He exhibits no distension. There is no tenderness.  Musculoskeletal: Normal range of motion.  Neurological: He is alert and oriented to person, place, and time.  Skin: Skin is warm and dry.  Psychiatric: He has a normal mood and affect. Judgment normal.    ED Course  Procedures (including critical care time)  Labs Reviewed - No data to display No results found.   1. Pain, dental       MDM  No obvious abscess rx clindamycin rx hydrocodone OTC ibuprofen F/u with dentist of choice.  Worthy Rancher, PA 07/23/11 1511

## 2011-07-23 NOTE — Discharge Instructions (Signed)
Dental Pain A tooth ache may be caused by cavities (tooth decay). Cavities expose the nerve of the tooth to air and hot or cold temperatures. It may come from an infection or abscess (also called a boil or furuncle) around your tooth. It is also often caused by dental caries (tooth decay). This causes the pain you are having. DIAGNOSIS  Your caregiver can diagnose this problem by exam. TREATMENT   If caused by an infection, it may be treated with medications which kill germs (antibiotics) and pain medications as prescribed by your caregiver. Take medications as directed.   Only take over-the-counter or prescription medicines for pain, discomfort, or fever as directed by your caregiver.   Whether the tooth ache today is caused by infection or dental disease, you should see your dentist as soon as possible for further care.  SEEK MEDICAL CARE IF: The exam and treatment you received today has been provided on an emergency basis only. This is not a substitute for complete medical or dental care. If your problem worsens or new problems (symptoms) appear, and you are unable to meet with your dentist, call or return to this location. SEEK IMMEDIATE MEDICAL CARE IF:   You have a fever.   You develop redness and swelling of your face, jaw, or neck.   You are unable to open your mouth.   You have severe pain uncontrolled by pain medicine.  MAKE SURE YOU:   Understand these instructions.   Will watch your condition.   Will get help right away if you are not doing well or get worse.  Document Released: 01/19/2005 Document Revised: 01/08/2011 Document Reviewed: 09/07/2007 [facility] Patient Information 2012 [place], [place].   Take the meds as directed.  Take ibuprofen 800 mg every 8 hrs with food.  Follow up with the dentist of your choice ASAP.

## 2011-07-23 NOTE — ED Provider Notes (Signed)
Medical screening examination/treatment/procedure(s) were performed by non-physician practitioner and as supervising physician I was immediately available for consultation/collaboration.   Joya Gaskins, MD 07/23/11 [phone]

## 2011-07-23 NOTE — ED Notes (Signed)
Woke up this morning w/ toothache to right side of mouth, was seen for same 3 weeks ago and didn't follow up w/ dentist.

## 2011-08-23 ENCOUNTER — Emergency Department (HOSPITAL_COMMUNITY)
Admission: EM | Admit: 2011-08-23 | Discharge: 2011-08-23 | Disposition: A | Discharge status: Home / Self Care | Payer: Self-pay | Attending: Emergency Medicine | Admitting: Emergency Medicine

## 2011-08-23 ENCOUNTER — Encounter (HOSPITAL_COMMUNITY): Payer: Self-pay | Admitting: Emergency Medicine

## 2011-08-23 DIAGNOSIS — F17200 Nicotine dependence, unspecified, uncomplicated: Secondary | ICD-10-CM | POA: Insufficient documentation

## 2011-08-23 DIAGNOSIS — Z833 Family history of diabetes mellitus: Secondary | ICD-10-CM | POA: Insufficient documentation

## 2011-08-23 DIAGNOSIS — Z91018 Allergy to other foods: Secondary | ICD-10-CM | POA: Insufficient documentation

## 2011-08-23 DIAGNOSIS — Z880 Allergy status to penicillin: Secondary | ICD-10-CM | POA: Insufficient documentation

## 2011-08-23 DIAGNOSIS — Z91038 Other insect allergy status: Secondary | ICD-10-CM | POA: Insufficient documentation

## 2011-08-23 DIAGNOSIS — K089 Disorder of teeth and supporting structures, unspecified: Secondary | ICD-10-CM | POA: Insufficient documentation

## 2011-08-23 DIAGNOSIS — K0889 Other specified disorders of teeth and supporting structures: Secondary | ICD-10-CM

## 2011-08-23 DIAGNOSIS — Z8249 Family history of ischemic heart disease and other diseases of the circulatory system: Secondary | ICD-10-CM | POA: Insufficient documentation

## 2011-08-23 DIAGNOSIS — K029 Dental caries, unspecified: Secondary | ICD-10-CM

## 2011-08-23 MED ORDER — CLINDAMYCIN HCL 150 MG PO CAPS: 300.0000 mg | ORAL_CAPSULE | Freq: Three times a day (TID) | ORAL | Status: AC

## 2011-08-23 MED ORDER — NAPROXEN 250 MG PO TABS: 250.0000 mg | ORAL_TABLET | Freq: Two times a day (BID) | ORAL | Status: DC

## 2011-08-23 MED ORDER — HYDROCODONE-ACETAMINOPHEN 5-325 MG PO TABS: ORAL_TABLET | ORAL | Status: AC

## 2011-08-23 NOTE — ED Notes (Signed)
Pt c/o left lower toothache 

## 2011-08-23 NOTE — ED Provider Notes (Signed)
History     CSN: [id number]  Arrival date & time 08/23/11  1135   First MD Initiated Contact with Patient 08/23/11 1142      Chief Complaint  Patient presents with  . Dental Pain     HPI Pt was seen at 1200.  Per pt, c/o gradual onset and persistence of constant left lower teeth "pain" for the past several days. Denies fevers, no intra-oral edema, no rash, no facial swelling, no dysphagia, no neck pain.   The condition is aggravated by nothing. The condition is relieved by nothing. The symptoms have been associated with no other complaints. The patient has no significant history of serious medical conditions.     History reviewed. No pertinent past medical history.  Past Surgical History  Procedure Date  . Hernia repair     Family History  Problem Relation Age of Onset  . Hypertension Father   . Diabetes Father   . Hypertension Sister   . Diabetes Sister     History  Substance Use Topics  . Smoking status: Current Everyday Smoker -- 0.5 packs/day    Types: Cigarettes  . Smokeless tobacco: Not on file  . Alcohol Use: Yes     occasionally    Review of Systems ROS: Statement: All systems negative except as marked or noted in the HPI; Constitutional: Negative for fever and chills. ; ; Eyes: Negative for eye pain and discharge. ; ; ENMT: Positive for dental caries, dental hygiene poor and toothache. Negative for ear pain, bleeding gums, dental injury, facial deformity, facial swelling, hoarseness, nasal congestion, sinus pressure, sore throat, throat swelling and tongue swollen. ; ; Cardiovascular: Negative for chest pain, palpitations, diaphoresis, dyspnea and peripheral edema. ; ; Respiratory: Negative for cough, wheezing and stridor. ; ; Gastrointestinal: Negative for nausea, vomiting, diarrhea and abdominal pain. ; ; Genitourinary: Negative for dysuria, flank pain and hematuria. ; ; Musculoskeletal: Negative for back pain and neck pain. ; ; Skin: Negative for rash and skin  lesion. ; ; Neuro: Negative for headache, lightheadedness and neck stiffness. ;    Allergies  Penicillins; Bee venom; and Cranberry  Home Medications   Current Outpatient Rx  Name Route Sig Dispense Refill  . CLINDAMYCIN HCL 150 MG PO CAPS  2 tabs po TID 60 capsule 0  . HYDROCODONE-ACETAMINOPHEN 5-325 MG PO TABS  One tab po q 4-6 hrs prn pain 20 tablet 0    BP 128/76  Pulse 69  Temp 98.5 F (36.9 C) (Oral)  Resp 20  Ht 5' 7.75" (1.721 m)  Wt 220 lb (99.791 kg)  BMI 33.70 kg/m2  SpO2 99%  Physical Exam 1203: Physical examination: Vital signs and O2 SAT: Reviewed; Constitutional: Well developed, Well nourished, Well hydrated, In no acute distress; Head and Face: Normocephalic, Atraumatic; Eyes: EOMI, PERRL, No scleral icterus; ENMT: Mouth and pharynx normal, Poor dentition, Widespread dental decay, Left TM normal, Right TM normal, Mucous membranes moist, +lower left 1st and 2nd premolar with dental decay.  No gingival erythema, edema, fluctuance, or drainage.  No hoarse voice, no drooling, no stridor.  ; Neck: Supple, Full range of motion, No lymphadenopathy; Cardiovascular: Regular rate and rhythm, No murmur, rub, or gallop; Respiratory: Breath sounds clear & equal bilaterally, No rales, rhonchi, wheezes, or rub, Normal respiratory effort/excursion; Chest: Nontender, Movement normal; Extremities: Pulses normal, No tenderness, No edema; Neuro: AA&Ox3, Major CN grossly intact.  No gross focal motor or sensory deficits in extremities.; Skin: Color normal, No rash, No petechiae, Warm,  Dry   ED Course  Procedures   MDM  MDM Reviewed: nursing note and vitals     12:04 PM:  Pt encouraged to f/u with dentist or oral surgeon for his dental needs for good continuity of care and definitive treatment.  Verb understanding.          Laray Anger, DO 08/24/11 1619

## 2011-09-08 ENCOUNTER — Encounter (HOSPITAL_COMMUNITY): Payer: Self-pay

## 2011-09-08 ENCOUNTER — Emergency Department (HOSPITAL_COMMUNITY)
Admission: EM | Admit: 2011-09-08 | Discharge: 2011-09-08 | Disposition: A | Discharge status: Home / Self Care | Payer: Self-pay | Attending: Emergency Medicine | Admitting: Emergency Medicine

## 2011-09-08 DIAGNOSIS — F17200 Nicotine dependence, unspecified, uncomplicated: Secondary | ICD-10-CM | POA: Insufficient documentation

## 2011-09-08 DIAGNOSIS — K029 Dental caries, unspecified: Secondary | ICD-10-CM | POA: Insufficient documentation

## 2011-09-08 MED ORDER — HYDROCODONE-ACETAMINOPHEN 5-325 MG PO TABS: 1.0000 | ORAL_TABLET | ORAL | Status: AC | PRN

## 2011-09-08 MED ORDER — HYDROCODONE-ACETAMINOPHEN 5-325 MG PO TABS
1.0000 | ORAL_TABLET | Freq: Once | ORAL | Status: AC
  Administered 2011-09-08: 1 via ORAL
  Filled 2011-09-08: qty 1

## 2011-09-08 MED ORDER — CLINDAMYCIN HCL 150 MG PO CAPS: 150.0000 mg | ORAL_CAPSULE | Freq: Four times a day (QID) | ORAL | Status: AC

## 2011-09-08 NOTE — ED Provider Notes (Signed)
Medical screening examination/treatment/procedure(s) were performed by non-physician practitioner and as supervising physician I was immediately available for consultation/collaboration.   Jarad Barth B. Bernette Mayers, MD 09/08/11 1451

## 2011-09-08 NOTE — ED Provider Notes (Signed)
History     CSN: [id number]  Arrival date & time 09/08/11  4098   First MD Initiated Contact with Patient 09/08/11 1000      Chief Complaint  Patient presents with  . Dental Pain    (Consider location/radiation/quality/duration/timing/severity/associated sxs/prior treatment) HPI Comments: Miguel Robbins [place]  presents with a 1 day history of dental pain.  He has a known cavity in the tooth and was recently treated here with a course of clindamycin and hydrocodone.  The swelling has resolved but he has developed pain again this morning upon waking.  There has been no fevers,  Chills, nausea or vomiting, also no complaint of difficulty swallowing,  Although chewing makes pain worse.  The patient has tried ibuprofen without relief of symptoms.      The history is provided by the patient.    History reviewed. No pertinent past medical history.  Past Surgical History  Procedure Date  . Hernia repair     Family History  Problem Relation Age of Onset  . Hypertension Father   . Diabetes Father   . Hypertension Sister   . Diabetes Sister     History  Substance Use Topics  . Smoking status: Current Everyday Smoker -- 0.5 packs/day    Types: Cigarettes  . Smokeless tobacco: Not on file  . Alcohol Use: Yes     occasionally      Review of Systems  Constitutional: Negative for fever.  HENT: Positive for dental problem. Negative for sore throat, facial swelling, neck pain and neck stiffness.   Respiratory: Negative for shortness of breath.     Allergies  Penicillins; Bee venom; and Cranberry  Home Medications   Current Outpatient Rx  Name Route Sig Dispense Refill  . CLINDAMYCIN HCL 150 MG PO CAPS  2 tabs po TID 60 capsule 0  . CLINDAMYCIN HCL 150 MG PO CAPS Oral Take 1 capsule (150 mg total) by mouth every 6 (six) hours. 28 capsule 0  . HYDROCODONE-ACETAMINOPHEN 5-325 MG PO TABS  One tab po q 4-6 hrs prn pain 20 tablet 0  . HYDROCODONE-ACETAMINOPHEN 5-325 MG PO TABS Oral  Take 1 tablet by mouth every 4 (four) hours as needed for pain. 15 tablet 0  . NAPROXEN 250 MG PO TABS Oral Take 1 tablet (250 mg total) by mouth 2 (two) times daily with a meal. 14 tablet 0    BP 125/80  Pulse 60  Temp 98.4 F (36.9 C) (Oral)  Resp 20  Ht 5\' 7"  (1.702 m)  Wt 220 lb (99.791 kg)  BMI 34.46 kg/m2  SpO2 100%  Physical Exam  Constitutional: He is oriented to person, place, and time. He appears well-developed and well-nourished. No distress.  HENT:  Head: Normocephalic and atraumatic. No trismus in the jaw.  Right Ear: Tympanic membrane and external ear normal.  Left Ear: Tympanic membrane and external ear normal.  Mouth/Throat: Oropharynx is clear and moist and mucous membranes are normal. No oral lesions. Abnormal dentition. Dental caries present. No dental abscesses.    Eyes: Conjunctivae are normal.  Neck: Normal range of motion. Neck supple.  Cardiovascular: Normal rate and normal heart sounds.   Pulmonary/Chest: Effort normal.  Abdominal: He exhibits no distension.  Musculoskeletal: Normal range of motion.  Lymphadenopathy:    He has no cervical adenopathy.  Neurological: He is alert and oriented to person, place, and time.  Skin: Skin is warm and dry. No erythema.  Psychiatric: He has a normal mood and affect.  ED Course  Procedures (including critical care time)  Labs Reviewed - No data to display No results found.   1. Dental decay       MDM  Pt has appt with health dept the last week of this month for dental assistance.  In the interim,  Prescribed hydrocdone. Clindamycin - advised to take new round of abx only if he develops swelling around the tooth.        Burgess Amor, [place] 09/08/11 1034

## 2011-09-08 NOTE — ED Notes (Signed)
Toothache for last 3 months --left back of mouth

## 2011-09-23 ENCOUNTER — Emergency Department (HOSPITAL_COMMUNITY)
Admission: EM | Admit: 2011-09-23 | Discharge: 2011-09-23 | Disposition: A | Discharge status: Home / Self Care | Payer: Self-pay | Attending: Emergency Medicine | Admitting: Emergency Medicine

## 2011-09-23 ENCOUNTER — Encounter (HOSPITAL_COMMUNITY): Payer: Self-pay | Admitting: *Deleted

## 2011-09-23 DIAGNOSIS — Z8249 Family history of ischemic heart disease and other diseases of the circulatory system: Secondary | ICD-10-CM | POA: Insufficient documentation

## 2011-09-23 DIAGNOSIS — Z880 Allergy status to penicillin: Secondary | ICD-10-CM | POA: Insufficient documentation

## 2011-09-23 DIAGNOSIS — Z91038 Other insect allergy status: Secondary | ICD-10-CM | POA: Insufficient documentation

## 2011-09-23 DIAGNOSIS — F17200 Nicotine dependence, unspecified, uncomplicated: Secondary | ICD-10-CM | POA: Insufficient documentation

## 2011-09-23 DIAGNOSIS — K089 Disorder of teeth and supporting structures, unspecified: Secondary | ICD-10-CM | POA: Insufficient documentation

## 2011-09-23 DIAGNOSIS — Z833 Family history of diabetes mellitus: Secondary | ICD-10-CM | POA: Insufficient documentation

## 2011-09-23 DIAGNOSIS — Z91018 Allergy to other foods: Secondary | ICD-10-CM | POA: Insufficient documentation

## 2011-09-23 DIAGNOSIS — K0889 Other specified disorders of teeth and supporting structures: Secondary | ICD-10-CM

## 2011-09-23 MED ORDER — HYDROCODONE-ACETAMINOPHEN 5-500 MG PO TABS: 1.0000 | ORAL_TABLET | ORAL | Status: AC | PRN

## 2011-09-23 MED ORDER — CLINDAMYCIN HCL 150 MG PO CAPS: 300.0000 mg | ORAL_CAPSULE | Freq: Four times a day (QID) | ORAL | Status: AC

## 2011-09-23 MED ORDER — OXYCODONE-ACETAMINOPHEN 5-325 MG PO TABS
1.0000 | ORAL_TABLET | Freq: Once | ORAL | Status: AC
  Administered 2011-09-23: 1 via ORAL
  Filled 2011-09-23: qty 1

## 2011-09-23 NOTE — ED Provider Notes (Signed)
History     CSN: [id number]  Arrival date & time 09/23/11  0945   First MD Initiated Contact with Patient 09/23/11 1006      Chief Complaint  Patient presents with  . Dental Pain     The history is provided by the patient.  The patients presents with 2 day/s of dental pain. The pain is moderate. It is worsened by nothing. It is improved by nothing including NSAIDs. The patient denies recent dental trauma. They deny fever or chills. They have not sought prior care for this. It is described as tender. The dental pain is located in left lower first molar. There is not associated facial swelling.    History reviewed. No pertinent past medical history.  Past Surgical History  Procedure Date  . Hernia repair     Family History  Problem Relation Age of Onset  . Hypertension Father   . Diabetes Father   . Hypertension Sister   . Diabetes Sister     History  Substance Use Topics  . Smoking status: Current Everyday Smoker -- 0.5 packs/day    Types: Cigarettes  . Smokeless tobacco: Not on file  . Alcohol Use: Yes     occasionally      Review of Systems  All other systems reviewed and are negative.    Allergies  Penicillins; Bee venom; and Cranberry  Home Medications   Current Outpatient Rx  Name Route Sig Dispense Refill  . CLINDAMYCIN HCL 150 MG PO CAPS Oral Take 2 capsules (300 mg total) by mouth 4 (four) times daily. 56 capsule 0  . HYDROCODONE-ACETAMINOPHEN 5-500 MG PO TABS Oral Take 1 tablet by mouth every 4 (four) hours as needed for pain. 20 tablet 0    BP 128/73  Pulse 65  Temp 98.2 F (36.8 C) (Oral)  Resp 18  SpO2 98%  Physical Exam  Constitutional: He is oriented to person, place, and time. He appears well-developed and well-nourished.  HENT:  Head: Normocephalic.       Tenderness to left lower first molar without gingival swelling or fluctuance. Evidence of decay  Eyes: EOM are normal.  Neck: Normal range of motion.  Pulmonary/Chest: Effort  normal.  Abdominal: He exhibits no distension.  Musculoskeletal: Normal range of motion.  Neurological: He is alert and oriented to person, place, and time.  Psychiatric: He has a normal mood and affect.    ED Course  Procedures (including critical care time)  Labs Reviewed - No data to display No results found.   1. Pain, dental       MDM  Dental Pain. Home with antibiotics and pain medicine. Recommend dental follow up. No signs of gingival abscess. Tolerating secretions. Airway patent. No sub lingular swelling         Lyanne Co, MD 09/23/11 1014

## 2011-09-23 NOTE — ED Notes (Signed)
Pt is here with left lower tooth pain that throbbs

## 2011-10-03 ENCOUNTER — Encounter (HOSPITAL_COMMUNITY): Payer: Self-pay | Admitting: *Deleted

## 2011-10-03 ENCOUNTER — Emergency Department (HOSPITAL_COMMUNITY)
Admission: EM | Admit: 2011-10-03 | Discharge: 2011-10-03 | Disposition: A | Discharge status: Home / Self Care | Payer: Self-pay | Attending: Emergency Medicine | Admitting: Emergency Medicine

## 2011-10-03 DIAGNOSIS — Z91018 Allergy to other foods: Secondary | ICD-10-CM | POA: Insufficient documentation

## 2011-10-03 DIAGNOSIS — K089 Disorder of teeth and supporting structures, unspecified: Secondary | ICD-10-CM | POA: Insufficient documentation

## 2011-10-03 DIAGNOSIS — I10 Essential (primary) hypertension: Secondary | ICD-10-CM | POA: Insufficient documentation

## 2011-10-03 DIAGNOSIS — E119 Type 2 diabetes mellitus without complications: Secondary | ICD-10-CM | POA: Insufficient documentation

## 2011-10-03 DIAGNOSIS — F17200 Nicotine dependence, unspecified, uncomplicated: Secondary | ICD-10-CM | POA: Insufficient documentation

## 2011-10-03 DIAGNOSIS — Z880 Allergy status to penicillin: Secondary | ICD-10-CM | POA: Insufficient documentation

## 2011-10-03 DIAGNOSIS — Z91038 Other insect allergy status: Secondary | ICD-10-CM | POA: Insufficient documentation

## 2011-10-03 DIAGNOSIS — K0889 Other specified disorders of teeth and supporting structures: Secondary | ICD-10-CM

## 2011-10-03 MED ORDER — HYDROCODONE-ACETAMINOPHEN 5-500 MG PO TABS: 1.0000 | ORAL_TABLET | ORAL | Status: AC | PRN

## 2011-10-03 MED ORDER — CIPROFLOXACIN HCL 500 MG PO TABS: 500.0000 mg | ORAL_TABLET | Freq: Two times a day (BID) | ORAL | Status: AC

## 2011-10-03 MED ORDER — MELOXICAM 7.5 MG PO TABS: 7.5000 mg | ORAL_TABLET | Freq: Every day | ORAL | Status: DC

## 2011-10-03 NOTE — ED Notes (Signed)
Pt is here for dental pain.  Pain in left lower jaw, tooth appears to have a existing large filling.  Pt woke up at 3am with this pain.

## 2011-10-03 NOTE — ED Provider Notes (Signed)
History     CSN: [id number]  Arrival date & time 10/03/11  0905   First MD Initiated Contact with Patient 10/03/11 [phone]      Chief Complaint  Patient presents with  . Dental Pain    (Consider location/radiation/quality/duration/timing/severity/associated sxs/prior treatment) Patient is a 31 y.o. male presenting with tooth pain. The history is provided by the patient.  Dental PainThe primary symptoms include mouth pain and headaches. Primary symptoms do not include dental injury, shortness of breath or cough. The symptoms began more than 1 month ago. The symptoms are unchanged. The symptoms occur frequently.  The headache is not associated with photophobia.  Additional symptoms include: dental sensitivity to temperature, gum swelling and jaw pain. Additional symptoms do not include: nosebleeds. Medical issues include: alcohol problem and smoking.    History reviewed. No pertinent past medical history.  Past Surgical History  Procedure Date  . Hernia repair     Family History  Problem Relation Age of Onset  . Hypertension Father   . Diabetes Father   . Hypertension Sister   . Diabetes Sister     History  Substance Use Topics  . Smoking status: Current Everyday Smoker -- 0.5 packs/day    Types: Cigarettes  . Smokeless tobacco: Not on file  . Alcohol Use: Yes     occasionally      Review of Systems  Constitutional: Negative for activity change.       All ROS Neg except as noted in HPI  HENT: Positive for dental problem. Negative for nosebleeds and neck pain.   Eyes: Negative for photophobia and discharge.  Respiratory: Negative for cough, shortness of breath and wheezing.   Cardiovascular: Negative for chest pain and palpitations.  Gastrointestinal: Negative for abdominal pain and blood in stool.  Genitourinary: Negative for dysuria, frequency and hematuria.  Musculoskeletal: Negative for back pain and arthralgias.  Skin: Negative.   Neurological: Positive for  headaches. Negative for dizziness, seizures and speech difficulty.  Psychiatric/Behavioral: Negative for hallucinations and confusion.    Allergies  Penicillins; Bee venom; and Cranberry  Home Medications   Current Outpatient Rx  Name Route Sig Dispense Refill  . CLINDAMYCIN HCL 150 MG PO CAPS Oral Take 2 capsules (300 mg total) by mouth 4 (four) times daily. 56 capsule 0  . HYDROCODONE-ACETAMINOPHEN 5-500 MG PO TABS Oral Take 1 tablet by mouth every 4 (four) hours as needed for pain. 20 tablet 0    BP 120/77  Pulse 67  Temp 98.3 F (36.8 C)  Resp 22  SpO2 98%  Physical Exam  Nursing note and vitals reviewed. Constitutional: He is oriented to person, place, and time. He appears well-developed and well-nourished.  Non-toxic appearance.  HENT:  Head: Normocephalic.  Right Ear: Tympanic membrane and external ear normal.  Left Ear: Tympanic membrane and external ear normal.       Patient has a deep cavity to the left lower molar area. There is also cavities on the right upper molar area. But no significant tenderness to palpation of the right side. There is no visible abscess. There is minimal swelling around the gum area of the left lower molar area. The airway is patent. The uvula is in the midline. There is no swelling under the tongue.  Eyes: EOM and lids are normal. Pupils are equal, round, and reactive to light.  Neck: Normal range of motion. Neck supple. Carotid bruit is not present.  Cardiovascular: Normal rate, regular rhythm, normal heart sounds, intact distal pulses  and normal pulses.   No murmur heard. Pulmonary/Chest: Breath sounds normal. No respiratory distress.  Abdominal: Soft. Bowel sounds are normal. There is no tenderness. There is no guarding.  Musculoskeletal: Normal range of motion.  Lymphadenopathy:       Head (right side): No submandibular adenopathy present.       Head (left side): No submandibular adenopathy present.    He has no cervical adenopathy.    Neurological: He is alert and oriented to person, place, and time. He has normal strength. No cranial nerve deficit or sensory deficit.  Skin: Skin is warm and dry.  Psychiatric: He has a normal mood and affect. His speech is normal.    ED Course  Procedures (including critical care time)  Labs Reviewed - No data to display No results found.   No diagnosis found.    MDM  I have reviewed nursing notes, vital signs, and all appropriate lab and imaging results for this patient. Patient has multiple dental caries. The left lower molar area is more painful than the other areas at this time. Is no visible abscess. There no acute findings at this time. The vital signs are reviewed and found to be within normal limits. The patient is treated with Mobic 7.5 mg 2 times daily, Cipro 500 mg 2 times daily. And Vicodin one every 4 hours as needed for pain. Patient states he has spoken with a oral surgeon in [place] and is waiting for call back for assistance with his pain.       Kathie Dike, [place] 10/03/11 [phone]

## 2011-10-05 NOTE — ED Provider Notes (Signed)
Medical screening examination/treatment/procedure(s) were performed by non-physician practitioner and as supervising physician I was immediately available for consultation/collaboration.   Shelda Jakes, MD 10/05/11 [phone]

## 2011-10-12 ENCOUNTER — Emergency Department (HOSPITAL_COMMUNITY): Payer: Self-pay

## 2011-10-12 ENCOUNTER — Emergency Department (HOSPITAL_COMMUNITY)
Admission: EM | Admit: 2011-10-12 | Discharge: 2011-10-12 | Disposition: A | Discharge status: Home / Self Care | Payer: Self-pay | Attending: Emergency Medicine | Admitting: Emergency Medicine

## 2011-10-12 ENCOUNTER — Encounter (HOSPITAL_COMMUNITY): Payer: Self-pay | Admitting: Emergency Medicine

## 2011-10-12 DIAGNOSIS — S60221A Contusion of right hand, initial encounter: Secondary | ICD-10-CM

## 2011-10-12 DIAGNOSIS — F17200 Nicotine dependence, unspecified, uncomplicated: Secondary | ICD-10-CM | POA: Insufficient documentation

## 2011-10-12 DIAGNOSIS — S60229A Contusion of unspecified hand, initial encounter: Secondary | ICD-10-CM | POA: Insufficient documentation

## 2011-10-12 DIAGNOSIS — W230XXA Caught, crushed, jammed, or pinched between moving objects, initial encounter: Secondary | ICD-10-CM | POA: Insufficient documentation

## 2011-10-12 HISTORY — DX: Unspecified abdominal hernia without obstruction or gangrene: K46.9

## 2011-10-12 HISTORY — DX: Unspecified fracture of shaft of humerus, left arm, initial encounter for closed fracture: S42.302A

## 2011-10-12 NOTE — ED Notes (Signed)
Pt shut r hand in car door. Swelling noted just below middle finger to posterior hand. Moving all fingers. Can not make a full fist due to pain. Radial pulses strong. Nad.

## 2011-10-12 NOTE — ED Provider Notes (Signed)
History     CSN: [id number]  Arrival date & time 10/12/11  0830   First MD Initiated Contact with Patient 10/12/11 0901      Chief Complaint  Patient presents with  . Hand Pain    (Consider location/radiation/quality/duration/timing/severity/associated sxs/prior treatment) HPI Comments: Pt slammed car door on R hand.  R hand dominant.  Patient is a 31 y.o. male presenting with hand pain. The history is provided by the patient. No language interpreter was used.  Hand Pain This is a new problem. The current episode started today. The problem occurs constantly. The problem has been unchanged. Pertinent negatives include no fever. Exacerbated by: movement and palpation. He has tried ice for the symptoms. The treatment provided mild relief.    Past Medical History  Diagnosis Date  . Hernia   . Arm fracture, left     Past Surgical History  Procedure Date  . Hernia repair   . Hernia repair   . Left arm fracture     Family History  Problem Relation Age of Onset  . Hypertension Father   . Diabetes Father   . Hypertension Sister   . Diabetes Sister   . Hypertension Mother   . Hypertension Brother     History  Substance Use Topics  . Smoking status: Current Every Day Smoker -- 0.5 packs/day    Types: Cigarettes  . Smokeless tobacco: Not on file  . Alcohol Use: Yes     occasionally      Review of Systems  Constitutional: Negative for fever.  Musculoskeletal:       Hand injury   Skin: Negative for wound.  All other systems reviewed and are negative.    Allergies  Penicillins; Bee venom; and Cranberry  Home Medications   Current Outpatient Rx  Name Route Sig Dispense Refill  . CLINDAMYCIN HCL 150 MG PO CAPS Oral Take 1 capsule (150 mg total) by mouth every 6 (six) hours. 28 capsule 0  . IBUPROFEN 200 MG PO TABS Oral Take 200 mg by mouth every 6 (six) hours as needed. For pain    . OXYCODONE-ACETAMINOPHEN 5-325 MG PO TABS Oral Take 1 tablet by mouth every 4  (four) hours as needed for pain. 15 tablet 0    BP 129/71  Pulse 58  Temp 97.7 F (36.5 C) (Oral)  Resp 16  SpO2 100%  Physical Exam  Nursing note and vitals reviewed. Constitutional: He is oriented to person, place, and time. He appears well-developed and well-nourished.  HENT:  Head: Normocephalic and atraumatic.  Eyes: EOM are normal.  Neck: Normal range of motion.  Cardiovascular: Normal rate, regular rhythm, normal heart sounds and intact distal pulses.   Pulmonary/Chest: Effort normal and breath sounds normal. No respiratory distress.  Abdominal: Soft. He exhibits no distension. There is no tenderness.  Musculoskeletal: He exhibits tenderness.       Right hand: He exhibits decreased range of motion, tenderness and bony tenderness. He exhibits normal capillary refill, no deformity and no laceration. normal sensation noted. Normal strength noted.       Hands: Neurological: He is alert and oriented to person, place, and time.  Skin: Skin is warm and dry.  Psychiatric: He has a normal mood and affect. Judgment normal.    ED Course  Procedures (including critical care time)  Labs Reviewed - No data to display No results found.   1. Contusion of right hand       MDM  No fxs  Ice  Ibuprofen rx-percocet, 20        Evalina Field, [place] 10/22/11 1404

## 2011-10-16 ENCOUNTER — Encounter (HOSPITAL_COMMUNITY): Payer: Self-pay | Admitting: Family Medicine

## 2011-10-16 ENCOUNTER — Emergency Department (HOSPITAL_COMMUNITY)
Admission: EM | Admit: 2011-10-16 | Discharge: 2011-10-16 | Disposition: A | Discharge status: Home / Self Care | Payer: Self-pay | Attending: Emergency Medicine | Admitting: Emergency Medicine

## 2011-10-16 DIAGNOSIS — K089 Disorder of teeth and supporting structures, unspecified: Secondary | ICD-10-CM | POA: Insufficient documentation

## 2011-10-16 DIAGNOSIS — Z8249 Family history of ischemic heart disease and other diseases of the circulatory system: Secondary | ICD-10-CM | POA: Insufficient documentation

## 2011-10-16 DIAGNOSIS — Z833 Family history of diabetes mellitus: Secondary | ICD-10-CM | POA: Insufficient documentation

## 2011-10-16 DIAGNOSIS — Z880 Allergy status to penicillin: Secondary | ICD-10-CM | POA: Insufficient documentation

## 2011-10-16 DIAGNOSIS — Z91038 Other insect allergy status: Secondary | ICD-10-CM | POA: Insufficient documentation

## 2011-10-16 DIAGNOSIS — F17200 Nicotine dependence, unspecified, uncomplicated: Secondary | ICD-10-CM | POA: Insufficient documentation

## 2011-10-16 DIAGNOSIS — K0889 Other specified disorders of teeth and supporting structures: Secondary | ICD-10-CM

## 2011-10-16 DIAGNOSIS — Z91018 Allergy to other foods: Secondary | ICD-10-CM | POA: Insufficient documentation

## 2011-10-16 MED ORDER — OXYCODONE-ACETAMINOPHEN 5-325 MG PO TABS: 1.0000 | ORAL_TABLET | ORAL | Status: AC | PRN

## 2011-10-16 MED ORDER — CLINDAMYCIN HCL 150 MG PO CAPS: 150.0000 mg | ORAL_CAPSULE | Freq: Four times a day (QID) | ORAL | Status: AC

## 2011-10-16 NOTE — ED Provider Notes (Signed)
History     CSN: [id number]  Arrival date & time 10/16/11  7253   First MD Initiated Contact with Patient 10/16/11 1028      Chief Complaint  Patient presents with  . Dental Pain  . Hand Pain     HPI Miguel Robbins is a 31 y.o. male who presents to the ED with dental pain. The pain started this morning. He describes the pain as throbbing and constant. He rates the pain as 8/10. Low grade fever last night. Taking ibuprofen for pain without relief. Was already taking ibuprofen for hand injury form 10/12/11 when he came here for treatment. The history was provided by the patient.   Past Medical History  Diagnosis Date  . Hernia   . Arm fracture, left     Past Surgical History  Procedure Date  . Hernia repair   . Hernia repair   . Left arm fracture     Family History  Problem Relation Age of Onset  . Hypertension Father   . Diabetes Father   . Hypertension Sister   . Diabetes Sister   . Hypertension Mother   . Hypertension Brother     History  Substance Use Topics  . Smoking status: Current Every Day Smoker -- 0.5 packs/day    Types: Cigarettes  . Smokeless tobacco: Not on file  . Alcohol Use: Yes     occasionally      Review of Systems  Constitutional: Positive for fever. Negative for chills and activity change.  HENT: Positive for dental problem. Negative for ear pain, congestion, sore throat and facial swelling.   Respiratory: Negative for cough and wheezing.   Cardiovascular: Negative for chest pain and palpitations.  Gastrointestinal: Negative for nausea, vomiting and abdominal pain.  Genitourinary: Negative for dysuria, flank pain and difficulty urinating.  Musculoskeletal: Negative for back pain.       Right hand pain  Neurological: Negative for dizziness and light-headedness.  Psychiatric/Behavioral: Negative for confusion and agitation.    Allergies  Penicillins; Bee venom; and Cranberry  Home Medications   Current Outpatient Rx  Name Route  Sig Dispense Refill  . IBUPROFEN 200 MG PO TABS Oral Take 200 mg by mouth every 6 (six) hours as needed. For pain      BP 124/78  Pulse 65  Temp 98.6 F (37 C) (Oral)  Resp 16  Ht 5\' 7"  (1.702 m)  Wt 230 lb (104.327 kg)  BMI 36.02 kg/m2  SpO2 98%  Physical Exam  Nursing note and vitals reviewed. Constitutional: He is oriented to person, place, and time. He appears well-developed and well-nourished. No distress.  HENT:  Head: Normocephalic.  Right Ear: Tympanic membrane, external ear and ear canal normal.  Left Ear: Tympanic membrane, external ear and ear canal normal.  Mouth/Throat: Uvula is midline, oropharynx is clear and moist and mucous membranes are normal. Dental caries present.    Eyes: EOM are normal. Left eye exhibits no discharge.  Neck: Trachea normal. Neck supple.  Cardiovascular: Normal rate and regular rhythm.   Pulmonary/Chest: Effort normal and breath sounds normal.  Musculoskeletal:       Right hand with tenderness on palpation over dorsal aspect.   Neurological: He is alert and oriented to person, place, and time.  Skin: Skin is warm and dry.  Psychiatric: He has a normal mood and affect. His behavior is normal. Judgment and thought content normal.   Assessment: 31 y.o. male with dental pain left lower 3rd molar  Contusion right hand (previous injury)  Plan:  Pain management   Dentist as soon as possible Discussed with the patient and all questioned fully answered. He will follow up with the free dental clinic 10/30/11 @ [facility] or return here if any problems arise.      Medication List     As of 10/17/2011  7:56 AM    START taking these medications         clindamycin 150 MG capsule   Commonly known as: CLEOCIN   Take 1 capsule (150 mg total) by mouth every 6 (six) hours.      oxyCODONE-acetaminophen 5-325 MG per tablet   Commonly known as: PERCOCET/ROXICET   Take 1 tablet by mouth every 4 (four) hours as needed for pain.        ASK your doctor about these medications         ibuprofen 200 MG tablet   Commonly known as: ADVIL,MOTRIN          Where to get your medications    These are the prescriptions that you need to pick up.   You may get these medications from any pharmacy.         clindamycin 150 MG capsule   oxyCODONE-acetaminophen 5-325 MG per tablet           ED Course  Procedures       [facility], NP 10/17/11 [phone]

## 2011-10-16 NOTE — ED Notes (Signed)
Gave patient ice pack.  

## 2011-10-16 NOTE — ED Notes (Signed)
Pt. C/o tooth left-sided, toothache in lower mandible.  Pt. States pain started this morning.  Denies recent trauma to area.  Pt. Also c/o right hand pain.  States was seen at [facility] on this past Monday and hand pain has not been relieved.  Pt denies further trauma to hand since last seen.

## 2011-10-17 NOTE — ED Provider Notes (Signed)
Medical screening examination/treatment/procedure(s) were performed by non-physician practitioner and as supervising physician I was immediately available for consultation/collaboration.  Donnetta Hutching, MD 10/17/11 [phone]

## 2011-10-18 NOTE — ED Provider Notes (Signed)
History     CSN: [id number]  Arrival date & time 10/12/11  0830   First MD Initiated Contact with Patient 10/12/11 0901      Chief Complaint  Patient presents with  . Hand Pain    (Consider location/radiation/quality/duration/timing/severity/associated sxs/prior treatment) HPI  Past Medical History  Diagnosis Date  . Hernia   . Arm fracture, left     Past Surgical History  Procedure Date  . Hernia repair   . Hernia repair   . Left arm fracture     Family History  Problem Relation Age of Onset  . Hypertension Father   . Diabetes Father   . Hypertension Sister   . Diabetes Sister   . Hypertension Mother   . Hypertension Brother     History  Substance Use Topics  . Smoking status: Current Every Day Smoker -- 0.5 packs/day    Types: Cigarettes  . Smokeless tobacco: Not on file  . Alcohol Use: Yes     occasionally      Review of Systems  Allergies  Penicillins; Bee venom; and Cranberry  Home Medications   Current Outpatient Rx  Name Route Sig Dispense Refill  . CLINDAMYCIN HCL 150 MG PO CAPS Oral Take 1 capsule (150 mg total) by mouth every 6 (six) hours. 28 capsule 0  . IBUPROFEN 200 MG PO TABS Oral Take 200 mg by mouth every 6 (six) hours as needed. For pain    . OXYCODONE-ACETAMINOPHEN 5-325 MG PO TABS Oral Take 1 tablet by mouth every 4 (four) hours as needed for pain. 15 tablet 0    BP 129/71  Pulse 58  Temp 97.7 F (36.5 C) (Oral)  Resp 16  SpO2 100%  Physical Exam  ED Course  Procedures (including critical care time)  Labs Reviewed - No data to display No results found.   1. Contusion of right hand       MDM  Medical screening examination/treatment/procedure(s) were performed by non-physician practitioner and as supervising physician I was immediately available for consultation/collaboration.        Donnetta Hutching, MD 10/18/11 1150

## 2011-10-23 NOTE — ED Provider Notes (Signed)
Medical screening examination/treatment/procedure(s) were performed by non-physician practitioner and as supervising physician I was immediately available for consultation/collaboration.  Zaivion Kundrat, MD 10/23/11 1909 

## 2011-10-25 ENCOUNTER — Encounter (HOSPITAL_COMMUNITY): Payer: Self-pay

## 2011-10-25 ENCOUNTER — Emergency Department (HOSPITAL_COMMUNITY)
Admission: EM | Admit: 2011-10-25 | Discharge: 2011-10-25 | Disposition: A | Discharge status: Home / Self Care | Payer: Self-pay | Attending: Emergency Medicine | Admitting: Emergency Medicine

## 2011-10-25 DIAGNOSIS — F17200 Nicotine dependence, unspecified, uncomplicated: Secondary | ICD-10-CM | POA: Insufficient documentation

## 2011-10-25 DIAGNOSIS — Z880 Allergy status to penicillin: Secondary | ICD-10-CM | POA: Insufficient documentation

## 2011-10-25 DIAGNOSIS — Z91038 Other insect allergy status: Secondary | ICD-10-CM | POA: Insufficient documentation

## 2011-10-25 DIAGNOSIS — K047 Periapical abscess without sinus: Secondary | ICD-10-CM | POA: Insufficient documentation

## 2011-10-25 MED ORDER — HYDROCODONE-ACETAMINOPHEN 5-325 MG PO TABS: 1.0000 | ORAL_TABLET | ORAL | Status: AC | PRN

## 2011-10-25 MED ORDER — CLINDAMYCIN HCL 150 MG PO CAPS
150.0000 mg | ORAL_CAPSULE | Freq: Four times a day (QID) | ORAL | Status: DC
Start: 2011-10-25 — End: 2011-11-23

## 2011-10-25 NOTE — ED Notes (Signed)
Pt reports has been having toothache and noticed gum was swollen yesterday.  Reports area ruptured during the night and this am had blood in his mouth.  Says still bleeding some from area.

## 2011-10-25 NOTE — ED Notes (Signed)
C/o toothache to left lower tooth area, states that his gum was bleeding yesterday and that his jaw was swollen, felt like he had "something bust" in his mouth during the night. Still continues to have pain.

## 2011-10-25 NOTE — ED Provider Notes (Signed)
Medical screening examination/treatment/procedure(s) were performed by non-physician practitioner and as supervising physician I was immediately available for consultation/collaboration.   Glynn Octave, MD 10/25/11 [phone]

## 2011-10-25 NOTE — ED Provider Notes (Signed)
History     CSN: [id number]  Arrival date & time 10/25/11  1142   First MD Initiated Contact with Patient 10/25/11 1222      Chief Complaint  Patient presents with  . Dental Pain    (Consider location/radiation/quality/duration/timing/severity/associated sxs/prior treatment) HPI Comments: Miguel Robbins  presents with a 1 day history of dental pain and gingival swelling.   The patient has a history of decay of the tooth involved  involved which has recently started to cause increased pain. He has had increased abdominal swelling around this tooth which burst and drained pus and blood last night.  There has been no fevers,  Chills, nausea or vomiting, also no complaint of difficulty swallowing,  Although chewing makes pain worse.  The patient has tried ibuprofen without relief of symptoms.      The history is provided by the patient.    Past Medical History  Diagnosis Date  . Hernia   . Arm fracture, left     Past Surgical History  Procedure Date  . Hernia repair   . Hernia repair   . Left arm fracture     Family History  Problem Relation Age of Onset  . Hypertension Father   . Diabetes Father   . Hypertension Sister   . Diabetes Sister   . Hypertension Mother   . Hypertension Brother     History  Substance Use Topics  . Smoking status: Current Every Day Smoker -- 0.5 packs/day    Types: Cigarettes  . Smokeless tobacco: Not on file  . Alcohol Use: Yes     occasionally      Review of Systems  Constitutional: Negative for fever.  HENT: Positive for dental problem. Negative for sore throat, facial swelling, neck pain and neck stiffness.   Respiratory: Negative for shortness of breath.     Allergies  Penicillins; Bee venom; and Cranberry  Home Medications   Current Outpatient Rx  Name Route Sig Dispense Refill  . CLINDAMYCIN HCL 150 MG PO CAPS Oral Take 1 capsule (150 mg total) by mouth every 6 (six) hours. 28 capsule 0  . IBUPROFEN 200 MG PO TABS  Oral Take 200 mg by mouth every 6 (six) hours as needed. For pain    . OXYCODONE-ACETAMINOPHEN 5-325 MG PO TABS Oral Take 1 tablet by mouth every 4 (four) hours as needed for pain. 15 tablet 0  . CLINDAMYCIN HCL 150 MG PO CAPS Oral Take 1 capsule (150 mg total) by mouth every 6 (six) hours. 28 capsule 0  . HYDROCODONE-ACETAMINOPHEN 5-325 MG PO TABS Oral Take 1 tablet by mouth every 4 (four) hours as needed for pain. 15 tablet 0    BP 138/88  Pulse 66  Temp 98.5 F (36.9 C) (Oral)  Resp 18  Ht 5\' 7"  (1.702 m)  Wt 220 lb (99.791 kg)  BMI 34.46 kg/m2  SpO2 100%  Physical Exam  Constitutional: He is oriented to person, place, and time. He appears well-developed and well-nourished. No distress.  HENT:  Head: Normocephalic and atraumatic. No trismus in the jaw.  Right Ear: Tympanic membrane and external ear normal.  Left Ear: Tympanic membrane and external ear normal.  Mouth/Throat: Oropharynx is clear and moist and mucous membranes are normal. No oral lesions. Abnormal dentition. Dental caries present. No uvula swelling.       For anterior and and left upper dentition with mild edema and erythema of anterior gingiva with no appreciable abscess or identifiable drainage site.  Eyes: Conjunctivae normal are normal.  Neck: Normal range of motion. Neck supple.  Cardiovascular: Normal rate and normal heart sounds.   Pulmonary/Chest: Effort normal.  Abdominal: He exhibits no distension.  Musculoskeletal: Normal range of motion.  Lymphadenopathy:    He has no cervical adenopathy.  Neurological: He is alert and oriented to person, place, and time.  Skin: Skin is warm and dry. No erythema.  Psychiatric: He has a normal mood and affect.    ED Course  Procedures (including critical care time)  Labs Reviewed - No data to display No results found.   1. Dental abscess       MDM  Patient was prescribed clindamycin and hydrocodone for pain.  He was also referred to the free dental clinic  in [place] to take place in 5 days.  He is very motivated to go there for dental extractions.  He was also given dental referrals for [facility] in the event he is unable to make it to this clinic.      Burgess Amor, [place] 10/25/11 1747

## 2011-11-21 ENCOUNTER — Encounter (HOSPITAL_COMMUNITY): Payer: Self-pay | Admitting: Emergency Medicine

## 2011-11-21 ENCOUNTER — Emergency Department (HOSPITAL_COMMUNITY)
Admission: EM | Admit: 2011-11-21 | Discharge: 2011-11-21 | Disposition: A | Discharge status: Home / Self Care | Payer: Self-pay | Attending: Emergency Medicine | Admitting: Emergency Medicine

## 2011-11-21 DIAGNOSIS — Z0389 Encounter for observation for other suspected diseases and conditions ruled out: Secondary | ICD-10-CM | POA: Insufficient documentation

## 2011-11-21 NOTE — ED Notes (Signed)
Patient with c/o headache x 3 days. +n/v. Dizzy

## 2011-11-23 ENCOUNTER — Emergency Department (HOSPITAL_COMMUNITY)
Admission: EM | Admit: 2011-11-23 | Discharge: 2011-11-23 | Disposition: A | Discharge status: Home / Self Care | Payer: Self-pay | Attending: Emergency Medicine | Admitting: Emergency Medicine

## 2011-11-23 ENCOUNTER — Encounter (HOSPITAL_COMMUNITY): Payer: Self-pay | Admitting: *Deleted

## 2011-11-23 DIAGNOSIS — Z8719 Personal history of other diseases of the digestive system: Secondary | ICD-10-CM | POA: Insufficient documentation

## 2011-11-23 DIAGNOSIS — R110 Nausea: Secondary | ICD-10-CM | POA: Insufficient documentation

## 2011-11-23 DIAGNOSIS — R51 Headache: Secondary | ICD-10-CM | POA: Insufficient documentation

## 2011-11-23 DIAGNOSIS — Z8669 Personal history of other diseases of the nervous system and sense organs: Secondary | ICD-10-CM | POA: Insufficient documentation

## 2011-11-23 HISTORY — DX: Migraine, unspecified, not intractable, without status migrainosus: G43.909

## 2011-11-23 MED ORDER — METOCLOPRAMIDE HCL 5 MG/ML IJ SOLN
10.0000 mg | Freq: Once | INTRAMUSCULAR | Status: AC
  Administered 2011-11-23: 10 mg via INTRAMUSCULAR
  Filled 2011-11-23: qty 2

## 2011-11-23 MED ORDER — ISOMETHEPTENE-APAP-DICHLORAL 65-325-100 MG PO CAPS: 1.0000 | ORAL_CAPSULE | Freq: Four times a day (QID) | ORAL | Status: DC | PRN

## 2011-11-23 MED ORDER — DIPHENHYDRAMINE HCL 50 MG/ML IJ SOLN
25.0000 mg | Freq: Once | INTRAMUSCULAR | Status: DC
  Filled 2011-11-23: qty 1

## 2011-11-23 MED ORDER — KETOROLAC TROMETHAMINE 60 MG/2ML IM SOLN
60.0000 mg | Freq: Once | INTRAMUSCULAR | Status: AC
Start: 2011-11-23 — End: 2011-11-23
  Administered 2011-11-23: 60 mg via INTRAMUSCULAR
  Filled 2011-11-23: qty 2

## 2011-11-23 NOTE — ED Notes (Signed)
Headache for 5 days, nausea, vomited x1,  No hx of head injury,  Had migraines when a child.

## 2011-11-23 NOTE — ED Provider Notes (Signed)
History     CSN: [id number]  Arrival date & time 11/23/11  1646   First MD Initiated Contact with Patient 11/23/11 1758      Chief Complaint  Patient presents with  . Headache    (Consider location/radiation/quality/duration/timing/severity/associated sxs/prior treatment) Patient is a 31 y.o. male presenting with headaches. The history is provided by the patient. No language interpreter was used.  Headache  This is a new problem. Episode onset: 5 days. The problem occurs constantly. The problem has been gradually worsening. The headache is associated with nothing. The quality of the pain is described as dull. The pain is at a severity of 5/10. The pain is moderate. Associated symptoms include nausea. He has tried acetaminophen for the symptoms.  Pt tried Mother's migraine medication and vomittied  Past Medical History  Diagnosis Date  . Hernia   . Arm fracture, left   . Migraine     Past Surgical History  Procedure Date  . Hernia repair   . Hernia repair   . Left arm fracture     Family History  Problem Relation Age of Onset  . Hypertension Father   . Diabetes Father   . Hypertension Sister   . Diabetes Sister   . Hypertension Mother   . Hypertension Brother     History  Substance Use Topics  . Smoking status: Current Every Day Smoker -- 0.5 packs/day    Types: Cigarettes  . Smokeless tobacco: Not on file  . Alcohol Use: Yes     occasionally      Review of Systems  Gastrointestinal: Positive for nausea.  Neurological: Positive for headaches.  All other systems reviewed and are negative.    Allergies  Penicillins; Bee venom; and Cranberry  Home Medications  No current outpatient prescriptions on file.  BP 135/89  Pulse 65  Temp 98.5 F (36.9 C) (Oral)  Resp 20  Ht 5\' 7"  (1.702 m)  Wt 220 lb (99.791 kg)  BMI 34.46 kg/m2  SpO2 100%  Physical Exam  Nursing note and vitals reviewed. Constitutional: He is oriented to person, place, and time.  He appears well-developed and well-nourished.  HENT:  Head: Normocephalic and atraumatic.  Right Ear: External ear normal.  Left Ear: External ear normal.  Nose: Nose normal.  Mouth/Throat: Oropharynx is clear and moist.  Eyes: Conjunctivae normal and EOM are normal. Pupils are equal, round, and reactive to light.  Neck: Normal range of motion. Neck supple.  Cardiovascular: Normal rate and normal heart sounds.   Pulmonary/Chest: Effort normal.  Abdominal: Soft.  Musculoskeletal: Normal range of motion.  Neurological: He is alert and oriented to person, place, and time. He has normal reflexes.  Skin: Skin is warm.  Psychiatric: He has a normal mood and affect.    ED Course  Procedures (including critical care time)  Labs Reviewed - No data to display No results found.   1. Headache       MDM  Pt reports headache is better.  I will try midrin         Elson Areas, [place] 11/23/11 1911  Lonia Skinner [place], [place] 11/23/11 [phone]

## 2011-11-25 NOTE — ED Provider Notes (Signed)
Medical screening examination/treatment/procedure(s) were performed by non-physician practitioner and as supervising physician I was immediately available for consultation/collaboration.  Jones Skene, M.D.     Jones Skene, MD 11/25/11 0981

## 2011-11-26 ENCOUNTER — Emergency Department (HOSPITAL_COMMUNITY)
Admission: EM | Admit: 2011-11-26 | Discharge: 2011-11-26 | Disposition: A | Discharge status: Home / Self Care | Payer: Self-pay | Attending: Emergency Medicine | Admitting: Emergency Medicine

## 2011-11-26 ENCOUNTER — Encounter (HOSPITAL_COMMUNITY): Payer: Self-pay | Admitting: *Deleted

## 2011-11-26 DIAGNOSIS — Z9889 Other specified postprocedural states: Secondary | ICD-10-CM | POA: Insufficient documentation

## 2011-11-26 DIAGNOSIS — F1010 Alcohol abuse, uncomplicated: Secondary | ICD-10-CM | POA: Insufficient documentation

## 2011-11-26 DIAGNOSIS — F1011 Alcohol abuse, in remission: Secondary | ICD-10-CM

## 2011-11-26 DIAGNOSIS — Z8669 Personal history of other diseases of the nervous system and sense organs: Secondary | ICD-10-CM | POA: Insufficient documentation

## 2011-11-26 DIAGNOSIS — Z8781 Personal history of (healed) traumatic fracture: Secondary | ICD-10-CM | POA: Insufficient documentation

## 2011-11-26 DIAGNOSIS — E860 Dehydration: Secondary | ICD-10-CM | POA: Insufficient documentation

## 2011-11-26 DIAGNOSIS — F17200 Nicotine dependence, unspecified, uncomplicated: Secondary | ICD-10-CM | POA: Insufficient documentation

## 2011-11-26 LAB — URINALYSIS, ROUTINE W REFLEX MICROSCOPIC
Bilirubin Urine: NEGATIVE
Leukocytes, UA: NEGATIVE
Nitrite: NEGATIVE
Specific Gravity, Urine: >1.03 — ABNORMAL HIGH (ref 1.005–1.030)
pH: 6.5 (ref 5.0–8.0)

## 2011-11-26 LAB — URINE MICROSCOPIC-ADD ON

## 2011-11-26 NOTE — ED Notes (Addendum)
Pt states that when he first got up this am he noticed that his urine was the color of a dark soda drink, denies any symptoms, states that he has urinated since then and the color of his urine is normal.  Pt states that his urine was normal yesterday, admits to drinking "a lot of water", went to a party last night, did drink mr. pibb and moonshine together.

## 2011-11-27 NOTE — ED Provider Notes (Signed)
History     CSN: [id number]  Arrival date & time 11/26/11  1420   First MD Initiated Contact with Patient 11/26/11 1520      Chief Complaint  Patient presents with  . Illegal value: [    (Consider location/radiation/quality/duration/timing/severity/associated sxs/prior treatment) HPI Comments: Sigmund L [place] presents with complaint of very dark,  [employer] colored urine when he first woke this am with progressively lighter urine as the day has progressed.  He reports drinking approximately 8 oz of vodka last night, mixed with Mr. Freda Jackson and thought he was peeing Mr. Freda Jackson when he woke today.  He denies fevers, chills, nausea vomiting and denies abdominal pain.  He reports a distant history of frequent etoh abuse,  But has cut back, drinking heavy every several months on average.  He has had no changes in the color of his stool and no diarrhea.  He has taken no medications today,  But has tried to increase his water intake.  He denies dysuria and increased urinary frequency.  The history is provided by the patient.    Past Medical History  Diagnosis Date  . Hernia   . Arm fracture, left   . Migraine     Past Surgical History  Procedure Date  . Hernia repair   . Hernia repair   . Left arm fracture     Family History  Problem Relation Age of Onset  . Hypertension Father   . Diabetes Father   . Hypertension Sister   . Diabetes Sister   . Hypertension Mother   . Hypertension Brother     History  Substance Use Topics  . Smoking status: Current Every Day Smoker -- 0.5 packs/day    Types: Cigarettes  . Smokeless tobacco: Not on file  . Alcohol Use: Yes     occasionally      Review of Systems  Constitutional: Negative for fever and chills.  HENT: Negative for congestion, sore throat and neck pain.   Eyes: Negative.   Respiratory: Negative for chest tightness and shortness of breath.   Cardiovascular: Negative for chest pain.  Gastrointestinal: Negative for nausea,  vomiting, abdominal pain, blood in stool and abdominal distention.  Genitourinary: Negative.  Negative for dysuria, urgency, frequency, hematuria, decreased urine volume, discharge and difficulty urinating.  Musculoskeletal: Negative for joint swelling and arthralgias.  Skin: Negative.  Negative for color change, rash and wound.  Neurological: Negative for dizziness, weakness, light-headedness, numbness and headaches.  Hematological: Negative.   Psychiatric/Behavioral: Negative.     Allergies  Penicillins; Bee venom; and Cranberry  Home Medications   Current Outpatient Rx  Name Route Sig Dispense Refill  . ISOMETHEPTENE-APAP-DICHLORAL 65-325-100 MG PO CAPS Oral Take 1 capsule by mouth 4 (four) times daily as needed. 10 capsule 0    BP 129/82  Pulse 72  Temp 98.4 F (36.9 C)  Resp 18  SpO2 100%  Physical Exam  Nursing note and vitals reviewed. Constitutional: He appears well-developed and well-nourished.  HENT:  Head: Normocephalic and atraumatic.  Eyes: Conjunctivae normal are normal.  Neck: Normal range of motion.  Cardiovascular: Normal rate, regular rhythm, normal heart sounds and intact distal pulses.   Pulmonary/Chest: Effort normal and breath sounds normal. He has no wheezes.  Abdominal: Soft. Bowel sounds are normal. There is no tenderness.  Musculoskeletal: Normal range of motion.  Neurological: He is alert.  Skin: Skin is warm and dry.  Psychiatric: He has a normal mood and affect.    ED Course  Procedures (including critical care time)  Labs Reviewed  URINALYSIS, ROUTINE W REFLEX MICROSCOPIC - Abnormal; Notable for the following:    Specific Gravity, Urine >1.030 (*)     Protein, ur 30 (*)     Urobilinogen, UA 4.0 (*)     All other components within normal limits  URINE MICROSCOPIC-ADD ON  LAB REPORT - SCANNED   No results found.   1. Dehydration   2. History of ETOH abuse       MDM  Labs reviewed prior to dc.  Pt also discussed with Dr.  Lynelle Doctor.  Pt without jaundice, n/v, no abdominal pain.  No indication for additional lab workup.  Encouraged increased fluids.  Discussed moderation with etoh consumption.  Advised to obtain pcp - referrals given.        Burgess Amor, [place] 11/27/11 2117

## 2011-12-03 NOTE — ED Provider Notes (Signed)
Medical screening examination/treatment/procedure(s) were performed by non-physician practitioner and as supervising physician I was immediately available for consultation/collaboration. Devoria Albe, MD, Armando Gang   Ward Givens, MD 12/03/11 [phone]

## 2011-12-17 ENCOUNTER — Emergency Department (HOSPITAL_COMMUNITY)
Admission: EM | Admit: 2011-12-17 | Discharge: 2011-12-17 | Disposition: A | Discharge status: Home / Self Care | Payer: Self-pay | Attending: Emergency Medicine | Admitting: Emergency Medicine

## 2011-12-17 DIAGNOSIS — F17200 Nicotine dependence, unspecified, uncomplicated: Secondary | ICD-10-CM | POA: Insufficient documentation

## 2011-12-17 DIAGNOSIS — K0381 Cracked tooth: Secondary | ICD-10-CM | POA: Insufficient documentation

## 2011-12-17 DIAGNOSIS — K0889 Other specified disorders of teeth and supporting structures: Secondary | ICD-10-CM

## 2011-12-17 DIAGNOSIS — G43909 Migraine, unspecified, not intractable, without status migrainosus: Secondary | ICD-10-CM | POA: Insufficient documentation

## 2011-12-17 MED ORDER — HYDROCODONE-ACETAMINOPHEN 5-325 MG PO TABS: 1.0000 | ORAL_TABLET | ORAL | Status: DC | PRN

## 2011-12-17 MED ORDER — CLINDAMYCIN HCL 150 MG PO CAPS: 150.0000 mg | ORAL_CAPSULE | Freq: Three times a day (TID) | ORAL | Status: DC

## 2011-12-17 NOTE — ED Notes (Signed)
Pt present with c/o left, lower, back toothe pain.  Pt reports pain started this morning.  Pt denies n/v.  Pt reports tooth is broken.

## 2011-12-17 NOTE — ED Provider Notes (Signed)
History     CSN: [id number]  Arrival date & time 12/17/11  1821   First MD Initiated Contact with Patient 12/17/11 1845      Chief Complaint  Patient presents with  . Dental Pain    (Consider location/radiation/quality/duration/timing/severity/associated sxs/prior treatment) HPI Comments: Patient reports left lower dental pain that began this morning.  Pain is throbbing and constant, 8/10.  Denies fevers, facial swelling, sore throat, difficulty swallowing or breathing.  States he had the same problems with this tooth 1-2 months ago and was seen at [facility] ED, never followed up with dentist.   Patient is a 31 y.o. male presenting with tooth pain. The history is provided by the patient.  Dental PainPrimary symptoms do not include fever, shortness of breath or sore throat.    Past Medical History  Diagnosis Date  . Hernia   . Arm fracture, left   . Migraine     Past Surgical History  Procedure Date  . Hernia repair   . Hernia repair   . Left arm fracture     Family History  Problem Relation Age of Onset  . Hypertension Father   . Diabetes Father   . Hypertension Sister   . Diabetes Sister   . Hypertension Mother   . Hypertension Brother     History  Substance Use Topics  . Smoking status: Current Every Day Smoker -- 0.5 packs/day    Types: Cigarettes  . Smokeless tobacco: Not on file  . Alcohol Use: Yes     Comment: occasionally      Review of Systems  Constitutional: Negative for fever and chills.  HENT: Negative for sore throat and neck pain.   Respiratory: Negative for shortness of breath.     Allergies  Penicillins; Bee venom; Clindamycin/lincomycin; and Cranberry  Home Medications  No current outpatient prescriptions on file.  BP 134/62  Pulse 62  Temp 98 F (36.7 C) (Oral)  Resp 14  SpO2 100%  Physical Exam  Nursing note and vitals reviewed. Constitutional: He appears well-developed and well-nourished. No distress.  HENT:  Head:  Normocephalic and atraumatic.  Mouth/Throat: Uvula is midline and oropharynx is clear and moist. No uvula swelling. No oropharyngeal exudate, posterior oropharyngeal edema, posterior oropharyngeal erythema or tonsillar abscesses.         No facial swelling or tenderness.  Poor dentition.    Neck: Neck supple.  Pulmonary/Chest: Effort normal.  Neurological: He is alert.  Skin: He is not diaphoretic.    ED Course  Procedures (including critical care time)  Labs Reviewed - No data to display No results found.   1. Pain, dental     MDM  Pt c/o left lower molar pain.  Pt is afebrile, nontoxic.  No obvious infection or abscess.  No clinical concern for deeper infection including ludwig's angina.  Pt's left lower 1st molar is tender to percussion.  Pt given dental follow up, clindamycin, and norco #10.  Pt has side effect of itching with clindamycin - discussed this with patient.  Pt has "throat swelling" with penicillin and has never had rash or severe reaction to clindamycin.  Pt agrees with this plan, we discussed return precautions.  Advised ibuprofen for further pain relief.  Pt has filled multiple narcotic prescriptions over the past few months, mostly from the ED, though last was in September.  Discussed diagnosis, care plan, follow up with patient.  Pt given return precautions.  Pt verbalizes understanding and agrees with plan.  [place], [place] 12/18/11 0020

## 2011-12-17 NOTE — ED Notes (Signed)
Pt verbalizes understanding 

## 2011-12-22 NOTE — ED Provider Notes (Signed)
Medical screening examination/treatment/procedure(s) were performed by non-physician practitioner and as supervising physician I was immediately available for consultation/collaboration.   Lyanne Co, MD 12/22/11 [phone]

## 2012-05-17 ENCOUNTER — Encounter (HOSPITAL_COMMUNITY): Payer: Self-pay | Admitting: *Deleted

## 2012-05-17 ENCOUNTER — Emergency Department (HOSPITAL_COMMUNITY)
Admission: EM | Admit: 2012-05-17 | Discharge: 2012-05-17 | Disposition: A | Discharge status: Home / Self Care | Payer: Self-pay | Attending: Emergency Medicine | Admitting: Emergency Medicine

## 2012-05-17 DIAGNOSIS — Z8679 Personal history of other diseases of the circulatory system: Secondary | ICD-10-CM | POA: Insufficient documentation

## 2012-05-17 DIAGNOSIS — R197 Diarrhea, unspecified: Secondary | ICD-10-CM | POA: Insufficient documentation

## 2012-05-17 DIAGNOSIS — K529 Noninfective gastroenteritis and colitis, unspecified: Secondary | ICD-10-CM

## 2012-05-17 DIAGNOSIS — F17200 Nicotine dependence, unspecified, uncomplicated: Secondary | ICD-10-CM | POA: Insufficient documentation

## 2012-05-17 DIAGNOSIS — N189 Chronic kidney disease, unspecified: Secondary | ICD-10-CM | POA: Insufficient documentation

## 2012-05-17 DIAGNOSIS — Z8719 Personal history of other diseases of the digestive system: Secondary | ICD-10-CM | POA: Insufficient documentation

## 2012-05-17 DIAGNOSIS — Z8781 Personal history of (healed) traumatic fracture: Secondary | ICD-10-CM | POA: Insufficient documentation

## 2012-05-17 LAB — COMPREHENSIVE METABOLIC PANEL
ALT: 14 U/L (ref 0–53)
Albumin: 4.3 g/dL (ref 3.5–5.2)
Alkaline Phosphatase: 47 U/L (ref 39–117)
Potassium: 3.5 mEq/L (ref 3.5–5.1)
Sodium: 139 mEq/L (ref 135–145)
Total Protein: 7.7 g/dL (ref 6.0–8.3)

## 2012-05-17 LAB — CBC WITH DIFFERENTIAL/PLATELET
Basophils Relative: 1 % (ref 0–1)
Eosinophils Absolute: 0 10*3/uL (ref 0.0–0.7)
MCH: 30.7 pg (ref 26.0–34.0)
MCHC: 34.9 g/dL (ref 30.0–36.0)
Neutrophils Relative %: 62 % (ref 43–77)
Platelets: 252 10*3/uL (ref 150–400)
RBC: 4.99 MIL/uL (ref 4.22–5.81)

## 2012-05-17 MED ORDER — ONDANSETRON HCL 4 MG/2ML IJ SOLN
4.0000 mg | Freq: Once | INTRAMUSCULAR | Status: AC
  Administered 2012-05-17: 4 mg via INTRAVENOUS
  Filled 2012-05-17: qty 2

## 2012-05-17 MED ORDER — SODIUM CHLORIDE 0.9 % IV BOLUS (SEPSIS)
1000.0000 mL | Freq: Once | INTRAVENOUS | Status: AC
  Administered 2012-05-17: 1000 mL via INTRAVENOUS

## 2012-05-17 MED ORDER — PROMETHAZINE HCL 25 MG PO TABS: 25.0000 mg | ORAL_TABLET | Freq: Four times a day (QID) | ORAL | Status: DC | PRN

## 2012-05-17 NOTE — ED Notes (Signed)
The patient states that he did syphon water out of a pond yesterday and swallowed some of the water, he states that he water had been in the pond since fall of last year, MD notified.

## 2012-05-17 NOTE — ED Notes (Signed)
Pt ambulated to restroom at this time without any difficulty. Miguel Robbins 

## 2012-05-17 NOTE — ED Notes (Signed)
Pt c/o n/v/d that started yesterday, diarrhea is better,still continues to have n/v, burning in neck and chest area when he vomits,

## 2012-05-17 NOTE — ED Notes (Addendum)
The patient states that he is able to keep food down, however when he drinks liquid with food, it will cause him to vomit.  States that he had diarrhea yesterday, none today.  States that he has had nausea and vomiting x2 days.  Appears in no acute distress, no complaints of pain.

## 2012-05-17 NOTE — ED Notes (Addendum)
The patient states that he is feeling much better, denies any further nausea or vomiting.  Ambulatory in the department to the restroom with steady gait, reports that his dizziness is gone as well.  PO fluids provided, no further complaints of nausea.

## 2012-05-17 NOTE — ED Provider Notes (Signed)
History     CSN: [id number]  Arrival date & time 05/17/12  1300   First MD Initiated Contact with Patient 05/17/12 1329      Chief Complaint  Patient presents with  . Emesis    (Consider location/radiation/quality/duration/timing/severity/associated sxs/prior treatment) Patient is a 32 y.o. male presenting with vomiting. The history is provided by the patient (pt complains of vomiting and some diarhea).  Emesis Severity:  Moderate Timing:  Constant Quality:  Bilious material Feeding tolerance: nothing. Progression:  Unchanged Chronicity:  New Recent urination:  Decreased Relieved by:  Nothing Associated symptoms: diarrhea   Associated symptoms: no abdominal pain and no headaches     Past Medical History  Diagnosis Date  . Hernia   . Arm fracture, left   . Migraine     Past Surgical History  Procedure Laterality Date  . Hernia repair    . Hernia repair    . Left arm fracture      Family History  Problem Relation Age of Onset  . Hypertension Father   . Diabetes Father   . Hypertension Sister   . Diabetes Sister   . Hypertension Mother   . Hypertension Brother     History  Substance Use Topics  . Smoking status: Current Every Day Smoker -- 0.50 packs/day    Types: Cigarettes  . Smokeless tobacco: Not on file  . Alcohol Use: Yes     Comment: occasionally      Review of Systems  Constitutional: Negative for appetite change and fatigue.  HENT: Negative for congestion, sinus pressure and ear discharge.   Eyes: Negative for discharge.  Respiratory: Negative for cough.   Cardiovascular: Negative for chest pain.  Gastrointestinal: Positive for vomiting and diarrhea. Negative for abdominal pain.  Genitourinary: Negative for frequency and hematuria.  Musculoskeletal: Negative for back pain.  Skin: Negative for rash.  Neurological: Negative for seizures and headaches.  Psychiatric/Behavioral: Negative for hallucinations.    Allergies  Penicillins; Bee  venom; Clindamycin/lincomycin; and Cranberry  Home Medications   Current Outpatient Rx  Name  Route  Sig  Dispense  Refill  . promethazine (PHENERGAN) 25 MG tablet   Oral   Take 1 tablet (25 mg total) by mouth every 6 (six) hours as needed for nausea.   15 tablet   0     BP 142/83  Pulse 81  Temp(Src) 97.8 F (36.6 C) (Oral)  Resp 16  Ht 5\' 7"  (1.702 m)  Wt 210 lb (95.255 kg)  BMI 32.88 kg/m2  SpO2 98%  Physical Exam  Constitutional: He is oriented to person, place, and time. He appears well-developed.  HENT:  Head: Normocephalic.  Eyes: Conjunctivae and EOM are normal. No scleral icterus.  Neck: Neck supple. No thyromegaly present.  Cardiovascular: Normal rate and regular rhythm.  Exam reveals no gallop and no friction rub.   No murmur heard. Pulmonary/Chest: No stridor. He has no wheezes. He has no rales. He exhibits no tenderness.  Abdominal: He exhibits no distension. There is no tenderness. There is no rebound.  Musculoskeletal: Normal range of motion. He exhibits no edema.  Lymphadenopathy:    He has no cervical adenopathy.  Neurological: He is oriented to person, place, and time. Coordination normal.  Skin: No rash noted. No erythema.  Psychiatric: He has a normal mood and affect. His behavior is normal.    ED Course  Procedures (including critical care time)  Labs Reviewed  CBC WITH DIFFERENTIAL - Abnormal; Notable for the following:  WBC 3.4 (*)    All other components within normal limits  COMPREHENSIVE METABOLIC PANEL   No results found.   1. Gastroenteritis       MDM          Benny Lennert, MD 05/17/12 [phone]

## 2012-05-20 ENCOUNTER — Encounter (HOSPITAL_COMMUNITY): Payer: Self-pay | Admitting: Emergency Medicine

## 2012-05-20 ENCOUNTER — Emergency Department (HOSPITAL_COMMUNITY)
Admission: EM | Admit: 2012-05-20 | Discharge: 2012-05-20 | Disposition: A | Discharge status: Home / Self Care | Payer: Self-pay | Attending: Emergency Medicine | Admitting: Emergency Medicine

## 2012-05-20 DIAGNOSIS — R1110 Vomiting, unspecified: Secondary | ICD-10-CM

## 2012-05-20 DIAGNOSIS — Z9889 Other specified postprocedural states: Secondary | ICD-10-CM | POA: Insufficient documentation

## 2012-05-20 DIAGNOSIS — Z87828 Personal history of other (healed) physical injury and trauma: Secondary | ICD-10-CM | POA: Insufficient documentation

## 2012-05-20 DIAGNOSIS — R112 Nausea with vomiting, unspecified: Secondary | ICD-10-CM | POA: Insufficient documentation

## 2012-05-20 DIAGNOSIS — Z8679 Personal history of other diseases of the circulatory system: Secondary | ICD-10-CM | POA: Insufficient documentation

## 2012-05-20 DIAGNOSIS — Z8719 Personal history of other diseases of the digestive system: Secondary | ICD-10-CM | POA: Insufficient documentation

## 2012-05-20 DIAGNOSIS — F17200 Nicotine dependence, unspecified, uncomplicated: Secondary | ICD-10-CM | POA: Insufficient documentation

## 2012-05-20 DIAGNOSIS — R197 Diarrhea, unspecified: Secondary | ICD-10-CM | POA: Insufficient documentation

## 2012-05-20 LAB — CBC
HCT: 43.5 % (ref 39.0–52.0)
MCHC: 34.7 g/dL (ref 30.0–36.0)
MCV: 88.2 fL (ref 78.0–100.0)
RDW: 13.1 % (ref 11.5–15.5)
WBC: 5.7 10*3/uL (ref 4.0–10.5)

## 2012-05-20 LAB — BASIC METABOLIC PANEL
BUN: 11 mg/dL (ref 6–23)
Chloride: 107 mEq/L (ref 96–112)
Creatinine, Ser: 0.89 mg/dL (ref 0.50–1.35)
GFR calc Af Amer: >90 mL/min (ref >90)
Glucose, Bld: 97 mg/dL (ref 70–99)

## 2012-05-20 MED ORDER — ONDANSETRON HCL 4 MG/2ML IJ SOLN
4.0000 mg | Freq: Once | INTRAMUSCULAR | Status: AC
  Administered 2012-05-20: 4 mg via INTRAVENOUS
  Filled 2012-05-20: qty 2

## 2012-05-20 MED ORDER — ONDANSETRON 4 MG PO TBDP: 4.0000 mg | ORAL_TABLET | Freq: Three times a day (TID) | ORAL | Status: DC | PRN

## 2012-05-20 NOTE — ED Provider Notes (Signed)
History     CSN: [id number]  Arrival date & time 05/20/12  0531   First MD Initiated Contact with Patient 05/20/12 0550      Chief Complaint  Patient presents with  . Nausea  . Emesis    (Consider location/radiation/quality/duration/timing/severity/associated sxs/prior treatment) HPI Miguel Robbins is a 32 y.o. male who presents to the Emergency Department complaining of continued nausea and vomiting. He was seen here on Tuesday and given phenergan. Since then he has continued to have vomiting and diarrhea. Denies fever, chills.   Past Medical History  Diagnosis Date  . Hernia   . Arm fracture, left   . Migraine     Past Surgical History  Procedure Laterality Date  . Hernia repair    . Hernia repair    . Left arm fracture      Family History  Problem Relation Age of Onset  . Hypertension Father   . Diabetes Father   . Hypertension Sister   . Diabetes Sister   . Hypertension Mother   . Hypertension Brother     History  Substance Use Topics  . Smoking status: Current Every Day Smoker -- 0.50 packs/day    Types: Cigarettes  . Smokeless tobacco: Not on file  . Alcohol Use: Yes     Comment: occasionally      Review of Systems  Constitutional: Negative for fever.       10 Systems reviewed and are negative for acute change except as noted in the HPI.  HENT: Negative for congestion.   Eyes: Negative for discharge and redness.  Respiratory: Negative for cough and shortness of breath.   Cardiovascular: Negative for chest pain.  Gastrointestinal: Positive for nausea, vomiting and diarrhea. Negative for abdominal pain.  Musculoskeletal: Negative for back pain.  Skin: Negative for rash.  Neurological: Negative for syncope, numbness and headaches.  Psychiatric/Behavioral:       No behavior change.    Allergies  Penicillins; Bee venom; Clindamycin/lincomycin; and Cranberry  Home Medications   Current Outpatient Rx  Name  Route  Sig  Dispense  Refill  .  promethazine (PHENERGAN) 25 MG tablet   Oral   Take 1 tablet (25 mg total) by mouth every 6 (six) hours as needed for nausea.   15 tablet   0     BP 135/92  Pulse 126  Temp(Src) 98.3 F (36.8 C) (Oral)  Resp 18  Ht 5\' 7"  (1.702 m)  Wt 210 lb (95.255 kg)  BMI 32.88 kg/m2  SpO2 98%  Physical Exam  Nursing note and vitals reviewed. Constitutional: He appears well-developed and well-nourished.  Awake, alert, nontoxic appearance.  HENT:  Head: Normocephalic and atraumatic.  Eyes: EOM are normal. Pupils are equal, round, and reactive to light.  Neck: Neck supple.  Cardiovascular: Normal heart sounds and intact distal pulses.   Pulmonary/Chest: Effort normal and breath sounds normal. He exhibits no tenderness.  Abdominal: Soft. Bowel sounds are normal. There is no tenderness. There is no rebound.  Musculoskeletal: He exhibits no tenderness.  Baseline ROM, no obvious new focal weakness.  Neurological:  Mental status and motor strength appears baseline for patient and situation.  Skin: No rash noted.  Psychiatric: He has a normal mood and affect.    ED Course  Procedures (including critical care time) Results for orders placed during the hospital encounter of 05/20/12  CBC      Result Value Range   WBC 5.7  4.0 - 10.5 K/uL   RBC  4.93  4.22 - 5.81 MIL/uL   Hemoglobin 15.1  13.0 - 17.0 g/dL   HCT 40.9  81.1 - 91.4 %   MCV 88.2  78.0 - 100.0 fL   MCH 30.6  26.0 - 34.0 pg   MCHC 34.7  30.0 - 36.0 g/dL   RDW 78.2  95.6 - 21.3 %   Platelets 246  150 - 400 K/uL      0630 Patient has received IVF and zofran. No vomiting or diarrhea since arrival. Has taken PO fluids. MDM  Patient with a vomiting/diarrhea illness present since Tuesday. Continued vomiting. Given IVF and zofran. Pt stable in ED with no significant deterioration in condition.The patient appears reasonably screened and/or stabilized for discharge and I doubt any other medical condition or other [facility] requiring  further screening, evaluation, or treatment in the ED at this time prior to discharge.  MDM Reviewed: nursing note and vitals Interpretation: labs           Nicoletta Dress. Colon Branch, MD 05/20/12 [phone]

## 2012-05-20 NOTE — ED Notes (Signed)
Dr. Strand at bedside to assess. 

## 2012-05-20 NOTE — ED Notes (Signed)
Patient states he was seen here on Tuesday for nausea and vomiting.  Patient states has been doing clear liquid diet and taking nausea medicine.  Patient states began vomiting last night again and noticed some blood in it this morning.

## 2012-05-20 NOTE — ED Notes (Signed)
Discharge instructions given and reviewed with patient.  Prescription given for Zofran; effects and use explained.  Patient verbalized understanding of use of Zofran and BRAT diet.  Patient ambulatory; discharged home in good condition.  Father accompanied discharge.

## 2012-05-20 NOTE — ED Notes (Signed)
Patient given Sprite for fluid challenge.  Father at bedside.

## 2012-11-06 ENCOUNTER — Encounter (HOSPITAL_COMMUNITY): Payer: Self-pay | Admitting: Emergency Medicine

## 2012-11-06 ENCOUNTER — Emergency Department (HOSPITAL_COMMUNITY)
Admission: EM | Admit: 2012-11-06 | Discharge: 2012-11-06 | Disposition: A | Discharge status: Home / Self Care | Payer: Self-pay | Attending: Emergency Medicine | Admitting: Emergency Medicine

## 2012-11-06 DIAGNOSIS — Z202 Contact with and (suspected) exposure to infections with a predominantly sexual mode of transmission: Secondary | ICD-10-CM | POA: Insufficient documentation

## 2012-11-06 DIAGNOSIS — Z8781 Personal history of (healed) traumatic fracture: Secondary | ICD-10-CM | POA: Insufficient documentation

## 2012-11-06 DIAGNOSIS — F17200 Nicotine dependence, unspecified, uncomplicated: Secondary | ICD-10-CM | POA: Insufficient documentation

## 2012-11-06 DIAGNOSIS — Z8719 Personal history of other diseases of the digestive system: Secondary | ICD-10-CM | POA: Insufficient documentation

## 2012-11-06 DIAGNOSIS — G43909 Migraine, unspecified, not intractable, without status migrainosus: Secondary | ICD-10-CM | POA: Insufficient documentation

## 2012-11-06 DIAGNOSIS — M722 Plantar fascial fibromatosis: Secondary | ICD-10-CM | POA: Insufficient documentation

## 2012-11-06 DIAGNOSIS — Z880 Allergy status to penicillin: Secondary | ICD-10-CM | POA: Insufficient documentation

## 2012-11-06 LAB — URINALYSIS, ROUTINE W REFLEX MICROSCOPIC
Hgb urine dipstick: NEGATIVE
Nitrite: NEGATIVE
pH: 6 (ref 5.0–8.0)

## 2012-11-06 LAB — URINE MICROSCOPIC-ADD ON

## 2012-11-06 MED ORDER — AZITHROMYCIN 250 MG PO TABS
1000.0000 mg | ORAL_TABLET | Freq: Once | ORAL | Status: AC
  Administered 2012-11-06: 1000 mg via ORAL
  Filled 2012-11-06: qty 4

## 2012-11-06 MED ORDER — CIPROFLOXACIN HCL 250 MG PO TABS
500.0000 mg | ORAL_TABLET | Freq: Once | ORAL | Status: AC
  Administered 2012-11-06: 500 mg via ORAL
  Filled 2012-11-06: qty 2

## 2012-11-06 MED ORDER — DEXAMETHASONE 4 MG PO TABS: ORAL_TABLET | ORAL | Status: DC

## 2012-11-06 MED ORDER — DICLOFENAC SODIUM 75 MG PO TBEC: 75.0000 mg | DELAYED_RELEASE_TABLET | Freq: Two times a day (BID) | ORAL | Status: DC

## 2012-11-06 MED ORDER — KETOROLAC TROMETHAMINE 10 MG PO TABS
10.0000 mg | ORAL_TABLET | Freq: Once | ORAL | Status: AC
  Administered 2012-11-06: 10 mg via ORAL
  Filled 2012-11-06: qty 1

## 2012-11-06 MED ORDER — DEXAMETHASONE SODIUM PHOSPHATE 4 MG/ML IJ SOLN
8.0000 mg | Freq: Once | INTRAMUSCULAR | Status: AC
  Administered 2012-11-06: 8 mg via INTRAMUSCULAR
  Filled 2012-11-06: qty 2

## 2012-11-06 NOTE — ED Notes (Signed)
Patient c/o left foot pain that radiates into left leg. Per patient started new job recently wearing boots, working 10 to [employer]. Patient states "There is a knot on the bottom of my foot. It has went down a little after I started walking on my tippy toes." Patient also states that he was recently contacted by previous sexual partner stating that she had STD. Patient denies any symptoms of STD and states that he has not had sexual relations with this person in over a month.

## 2012-11-09 NOTE — ED Provider Notes (Signed)
CSN: [id number]     Arrival date & time 11/06/12  1209 History   First MD Initiated Contact with Patient 11/06/12 1310     Chief Complaint  Patient presents with  . Foot Pain  . Exposure to STD   (Consider location/radiation/quality/duration/timing/severity/associated sxs/prior Treatment) HPI Comments: Pt states he would like to be checked for STD. States his pardner was recently diagnoses with STD and said he should be checked. No STD symptoms reported.  Patient is a 32 y.o. male presenting with lower extremity pain and STD exposure. The history is provided by the patient.  Foot Pain This is a new problem. The current episode started 1 to 4 weeks ago. The problem occurs daily. The problem has been gradually worsening. Associated symptoms include headaches. Pertinent negatives include no abdominal pain, arthralgias, chest pain, coughing, fever, joint swelling, neck pain, numbness or weakness. The symptoms are aggravated by standing and walking. He has tried acetaminophen for the symptoms. The treatment provided no relief.  Exposure to STD Associated symptoms include headaches. Pertinent negatives include no abdominal pain, arthralgias, chest pain, coughing, fever, joint swelling, neck pain, numbness or weakness.    Past Medical History  Diagnosis Date  . Hernia   . Arm fracture, left   . Migraine    Past Surgical History  Procedure Laterality Date  . Hernia repair    . Hernia repair    . Left arm fracture     Family History  Problem Relation Age of Onset  . Hypertension Father   . Diabetes Father   . Hypertension Sister   . Diabetes Sister   . Hypertension Mother   . Hypertension Brother    History  Substance Use Topics  . Smoking status: Current Every Day Smoker -- 0.50 packs/day for 15 years    Types: Cigarettes  . Smokeless tobacco: Never Used  . Alcohol Use: No    Review of Systems  Constitutional: Negative for fever and activity change.       All ROS Neg except  as noted in HPI  HENT: Negative for nosebleeds.   Eyes: Negative for photophobia and discharge.  Respiratory: Negative for cough, shortness of breath and wheezing.   Cardiovascular: Negative for chest pain and palpitations.  Gastrointestinal: Negative for abdominal pain and blood in stool.  Genitourinary: Negative for dysuria, frequency and hematuria.  Musculoskeletal: Negative for arthralgias, back pain, joint swelling and neck pain.  Skin: Negative.   Neurological: Positive for headaches. Negative for dizziness, seizures, speech difficulty, weakness and numbness.  Psychiatric/Behavioral: Negative for hallucinations and confusion.    Allergies  Penicillins; Bee venom; Clindamycin/lincomycin; and Cranberry  Home Medications   Current Outpatient Rx  Name  Route  Sig  Dispense  Refill  . cyclobenzaprine (FLEXERIL) 10 MG tablet   Oral   Take 10 mg by mouth once.         Marland Kitchen dexamethasone (DECADRON) 4 MG tablet      1 po bid with food   6 tablet   0   . diclofenac (VOLTAREN) 75 MG EC tablet   Oral   Take 1 tablet (75 mg total) by mouth 2 (two) times daily.   14 tablet   0    BP 116/72  Pulse 68  Temp(Src) 98.4 F (36.9 C) (Oral)  Resp 20  Ht 5\' 7"  (1.702 m)  Wt 206 lb (93.441 kg)  BMI 32.26 kg/m2  SpO2 100% Physical Exam  Nursing note and vitals reviewed. Constitutional: He is oriented  to person, place, and time. He appears well-developed and well-nourished.  Non-toxic appearance.  HENT:  Head: Normocephalic.  Right Ear: Tympanic membrane and external ear normal.  Left Ear: Tympanic membrane and external ear normal.  Eyes: EOM and lids are normal. Pupils are equal, round, and reactive to light.  Neck: Normal range of motion. Neck supple. Carotid bruit is not present.  Cardiovascular: Normal rate, regular rhythm, normal heart sounds, intact distal pulses and normal pulses.   Pulmonary/Chest: Breath sounds normal. No respiratory distress.  Abdominal: Soft. Bowel  sounds are normal. There is no tenderness. There is no guarding. Hernia confirmed negative in the right inguinal area and confirmed negative in the left inguinal area.  Genitourinary: Testes normal and penis normal. Circumcised. No hypospadias, penile erythema or penile tenderness. No discharge found.  Musculoskeletal: Normal range of motion.       Left foot: He exhibits tenderness. He exhibits normal capillary refill and no deformity.  Plantar area tenderness from the heel to the mid foot. No lesions between the toes. Cap refill less than 2 sec.  Lymphadenopathy:       Head (right side): No submandibular adenopathy present.       Head (left side): No submandibular adenopathy present.    He has no cervical adenopathy.  Neurological: He is alert and oriented to person, place, and time. He has normal strength. No cranial nerve deficit or sensory deficit.  Skin: Skin is warm and dry.  Psychiatric: He has a normal mood and affect. His speech is normal.    ED Course  Procedures (including critical care time) Labs Review Labs Reviewed  URINALYSIS, ROUTINE W REFLEX MICROSCOPIC - Abnormal; Notable for the following:    Specific Gravity, Urine >1.030 (*)    Bilirubin Urine SMALL (*)    Ketones, ur TRACE (*)    Protein, ur 100 (*)    All other components within normal limits  URINE MICROSCOPIC-ADD ON - Abnormal; Notable for the following:    Bacteria, UA MANY (*)    All other components within normal limits  GC/CHLAMYDIA PROBE AMP   Imaging Review No results found.  MDM   1. Plantar fasciitis   2. Exposure to STD    *I have reviewed nursing notes, vital signs, and all appropriate lab and imaging results for this patient.**  Foot exam is consistent with plantar fasciitis. UA reveals many bacteria in a male. Urethral culture sent to the lab. Plan - Pt treated in ED with cipro (anaphalaxis reaction to penicillin), and zithromax. Rx for decadron and voltaren given. Pt to see orthopedic MD  for additional testing and management if not improved. Pt advised to [place] shoes with good support.  Kathie Dike, PA-C 11/09/12 [phone]

## 2012-11-10 NOTE — ED Provider Notes (Signed)
Medical screening examination/treatment/procedure(s) were performed by non-physician practitioner and as supervising physician I was immediately available for consultation/collaboration.  Lyanne Co, MD 11/10/12 [phone]

## 2012-11-12 ENCOUNTER — Encounter (HOSPITAL_COMMUNITY): Payer: Self-pay | Admitting: Emergency Medicine

## 2012-11-12 ENCOUNTER — Emergency Department (HOSPITAL_COMMUNITY)
Admission: EM | Admit: 2012-11-12 | Discharge: 2012-11-12 | Disposition: A | Discharge status: Home / Self Care | Payer: Self-pay | Attending: Emergency Medicine | Admitting: Emergency Medicine

## 2012-11-12 DIAGNOSIS — Z8679 Personal history of other diseases of the circulatory system: Secondary | ICD-10-CM | POA: Insufficient documentation

## 2012-11-12 DIAGNOSIS — Z8719 Personal history of other diseases of the digestive system: Secondary | ICD-10-CM | POA: Insufficient documentation

## 2012-11-12 DIAGNOSIS — X503XXA Overexertion from repetitive movements, initial encounter: Secondary | ICD-10-CM | POA: Insufficient documentation

## 2012-11-12 DIAGNOSIS — Y9389 Activity, other specified: Secondary | ICD-10-CM | POA: Insufficient documentation

## 2012-11-12 DIAGNOSIS — F17200 Nicotine dependence, unspecified, uncomplicated: Secondary | ICD-10-CM | POA: Insufficient documentation

## 2012-11-12 DIAGNOSIS — Y990 Civilian activity done for income or pay: Secondary | ICD-10-CM | POA: Insufficient documentation

## 2012-11-12 DIAGNOSIS — Y9229 Other specified public building as the place of occurrence of the external cause: Secondary | ICD-10-CM | POA: Insufficient documentation

## 2012-11-12 DIAGNOSIS — Z880 Allergy status to penicillin: Secondary | ICD-10-CM | POA: Insufficient documentation

## 2012-11-12 DIAGNOSIS — S3981XA Other specified injuries of abdomen, initial encounter: Secondary | ICD-10-CM | POA: Insufficient documentation

## 2012-11-12 DIAGNOSIS — Z87828 Personal history of other (healed) physical injury and trauma: Secondary | ICD-10-CM | POA: Insufficient documentation

## 2012-11-12 DIAGNOSIS — R109 Unspecified abdominal pain: Secondary | ICD-10-CM

## 2012-11-12 LAB — CBC
HCT: 41.8 % (ref 39.0–52.0)
MCV: 89.7 fL (ref 78.0–100.0)
Platelets: 291 10*3/uL (ref 150–400)
RBC: 4.66 MIL/uL (ref 4.22–5.81)
RDW: 13 % (ref 11.5–15.5)
WBC: 7.1 10*3/uL (ref 4.0–10.5)

## 2012-11-12 LAB — COMPREHENSIVE METABOLIC PANEL
Albumin: 4.3 g/dL (ref 3.5–5.2)
BUN: 13 mg/dL (ref 6–23)
Calcium: 10.3 mg/dL (ref 8.4–10.5)
Creatinine, Ser: 0.87 mg/dL (ref 0.50–1.35)
GFR calc Af Amer: >90 mL/min (ref >90)
Glucose, Bld: 110 mg/dL — ABNORMAL HIGH (ref 70–99)
Total Protein: 7.8 g/dL (ref 6.0–8.3)

## 2012-11-12 LAB — URINE MICROSCOPIC-ADD ON

## 2012-11-12 LAB — URINALYSIS, ROUTINE W REFLEX MICROSCOPIC
Bilirubin Urine: NEGATIVE
Hgb urine dipstick: NEGATIVE
Nitrite: NEGATIVE
Protein, ur: 30 mg/dL — AB
Specific Gravity, Urine: >1.03 — ABNORMAL HIGH (ref 1.005–1.030)
Urobilinogen, UA: 0.2 mg/dL (ref 0.0–1.0)

## 2012-11-12 MED ORDER — OXYCODONE-ACETAMINOPHEN 5-325 MG PO TABS: 1.0000 | ORAL_TABLET | Freq: Four times a day (QID) | ORAL | Status: DC | PRN

## 2012-11-12 MED ORDER — IBUPROFEN 600 MG PO TABS: 600.0000 mg | ORAL_TABLET | Freq: Four times a day (QID) | ORAL | Status: DC | PRN

## 2012-11-12 NOTE — ED Provider Notes (Signed)
CSN: [id number]     Arrival date & time 11/12/12  1430 History   First MD Initiated Contact with Patient 11/12/12 1748    Scribed for [employer]. Soley Harriss, MD, the patient was seen in room APA11/APA11. This chart was scribed by Lewanda Rife, ED scribe. Patient's care was started at 6:09 PM  Chief Complaint  Patient presents with  . Abdominal Pain   (Consider location/radiation/quality/duration/timing/severity/associated sxs/prior Treatment) The history is provided by the patient. No language interpreter was used.   HPI Comments: Miguel Robbins is a 32 y.o. male who presents to the Emergency Department complaining of constant moderate right abdominal pain onset this morning. Reports he does some heavy lifting at his job in [employer]. Reports associated decreased appetite. Reports pain is exacerbated in certain positions and alleviated by nothing. Denies associated fall, recent injury, pain with urination, fever, nausea, emesis, and dysuria.  Additionally, reports he is worried about "his bad kidneys" since visit to the ED 6 days ago.   Past Medical History  Diagnosis Date  . Hernia   . Arm fracture, left   . Migraine    Past Surgical History  Procedure Laterality Date  . Hernia repair    . Hernia repair    . Left arm fracture     Family History  Problem Relation Age of Onset  . Hypertension Father   . Diabetes Father   . Hypertension Sister   . Diabetes Sister   . Hypertension Mother   . Hypertension Brother    History  Substance Use Topics  . Smoking status: Current Every Day Smoker -- 0.50 packs/day for 15 years    Types: Cigarettes  . Smokeless tobacco: Never Used  . Alcohol Use: No    Review of Systems  Constitutional: Negative for fever.  Gastrointestinal: Positive for abdominal pain.  Genitourinary: Negative.  Negative for dysuria.  All other systems reviewed and are negative.   A complete 10 system review of systems was obtained and all systems are  negative except as noted in the HPI and PMHx.    Allergies  Penicillins; Bee venom; Clindamycin/lincomycin; and Cranberry  Home Medications   Current Outpatient Rx  Name  Route  Sig  Dispense  Refill  . cyclobenzaprine (FLEXERIL) 10 MG tablet   Oral   Take 10 mg by mouth once.         . diclofenac (VOLTAREN) 75 MG EC tablet   Oral   Take 1 tablet (75 mg total) by mouth 2 (two) times daily.   14 tablet   0   . ibuprofen (ADVIL,MOTRIN) 600 MG tablet   Oral   Take 1 tablet (600 mg total) by mouth every 6 (six) hours as needed for pain.   10 tablet   0   . oxyCODONE-acetaminophen (PERCOCET/ROXICET) 5-325 MG per tablet   Oral   Take 1-2 tablets by mouth every 6 (six) hours as needed for pain.   8 tablet   0    BP 112/58  Pulse 88  Temp(Src) 98.5 F (36.9 C) (Oral)  Resp 16  Ht 5\' 7"  (1.702 m)  Wt 206 lb (93.441 kg)  BMI 32.26 kg/m2  SpO2 95% Physical Exam  Nursing note and vitals reviewed. Constitutional: He is oriented to person, place, and time. He appears well-developed and well-nourished. No distress.  HENT:  Head: Normocephalic and atraumatic.  Eyes: EOM are normal.  Neck: Neck supple. No tracheal deviation present.  Cardiovascular: Normal rate.   Pulmonary/Chest: Effort  normal. No respiratory distress.  Abdominal: Soft. Bowel sounds are normal. There is tenderness.  TTP of right flank  Musculoskeletal: Normal range of motion.  Neurological: He is alert and oriented to person, place, and time.  Skin: Skin is warm and dry.  Psychiatric: He has a normal mood and affect. His behavior is normal.    ED Course  Procedures (including critical care time)  COORDINATION OF CARE:  Nursing notes reviewed. Vital signs reviewed. Initial pt interview and examination performed.   6:09 PM-Discussed work up plan with pt at bedside, which includes UA, CBC, and CMP . Pt agrees with plan.   Treatment plan initiated:Medications - No data to display   Initial  diagnostic testing ordered.    Labs Review Labs Reviewed  URINALYSIS, ROUTINE W REFLEX MICROSCOPIC - Abnormal; Notable for the following:    Specific Gravity, Urine >1.030 (*)    Ketones, ur TRACE (*)    Protein, ur 30 (*)    All other components within normal limits  COMPREHENSIVE METABOLIC PANEL - Abnormal; Notable for the following:    Glucose, Bld 110 (*)    Alkaline Phosphatase 38 (*)    All other components within normal limits  CBC  URINE MICROSCOPIC-ADD ON   Imaging Review No results found.  EKG Interpretation   None       MDM   1. Flank pain    Patient with right flank pain. States she's worried about his kidneys since he was told that if he didn't drink lots of fluid he would have kidney issues. He does have some concentration of his urine, however he does not appear to be in renal failure. He does have flank pain on the right. It is likely musculoskeletal. No upper abdominal tenderness. We'll treat his pain medicine and will followup as needed.  I personally performed the services described in this documentation, which was scribed in my presence. The recorded information has been reviewed and is accurate.     Juliet Rude. Rubin Payor, MD 11/12/12 1810

## 2012-11-12 NOTE — ED Notes (Signed)
Pt states he was here last Sunday and was told kidney's were bad, per pt. Pt  States pain to right abdomen and side began this morning. States pain is worse with certain positions.

## 2013-07-03 ENCOUNTER — Emergency Department (HOSPITAL_COMMUNITY)
Admission: EM | Admit: 2013-07-03 | Discharge: 2013-07-03 | Disposition: A | Discharge status: Home / Self Care | Payer: Self-pay | Attending: Emergency Medicine | Admitting: Emergency Medicine

## 2013-07-03 ENCOUNTER — Encounter (HOSPITAL_COMMUNITY): Payer: Self-pay | Admitting: Emergency Medicine

## 2013-07-03 DIAGNOSIS — F17200 Nicotine dependence, unspecified, uncomplicated: Secondary | ICD-10-CM | POA: Insufficient documentation

## 2013-07-03 DIAGNOSIS — L678 Other hair color and hair shaft abnormalities: Secondary | ICD-10-CM | POA: Insufficient documentation

## 2013-07-03 DIAGNOSIS — L738 Other specified follicular disorders: Secondary | ICD-10-CM | POA: Insufficient documentation

## 2013-07-03 DIAGNOSIS — Z8719 Personal history of other diseases of the digestive system: Secondary | ICD-10-CM | POA: Insufficient documentation

## 2013-07-03 DIAGNOSIS — Z79899 Other long term (current) drug therapy: Secondary | ICD-10-CM | POA: Insufficient documentation

## 2013-07-03 DIAGNOSIS — G43909 Migraine, unspecified, not intractable, without status migrainosus: Secondary | ICD-10-CM | POA: Insufficient documentation

## 2013-07-03 DIAGNOSIS — L739 Follicular disorder, unspecified: Secondary | ICD-10-CM

## 2013-07-03 DIAGNOSIS — Z880 Allergy status to penicillin: Secondary | ICD-10-CM | POA: Insufficient documentation

## 2013-07-03 DIAGNOSIS — Z8781 Personal history of (healed) traumatic fracture: Secondary | ICD-10-CM | POA: Insufficient documentation

## 2013-07-03 DIAGNOSIS — Z791 Long term (current) use of non-steroidal anti-inflammatories (NSAID): Secondary | ICD-10-CM | POA: Insufficient documentation

## 2013-07-03 MED ORDER — SULFAMETHOXAZOLE-TRIMETHOPRIM 800-160 MG PO TABS: 1.0000 | ORAL_TABLET | Freq: Two times a day (BID) | ORAL | Status: DC

## 2013-07-03 NOTE — Discharge Instructions (Signed)
Ingrown Hair  An ingrown hair is a hair that curls and re-enters the skin instead of growing straight out of the skin. It happens most often with curly hair. It is usually more severe in the neck area, but it can occur in any shaved area, including the beard area, groin, scalp, and legs. An ingrown hair may cause small pockets of infection.  CAUSES   Shaving closely, tweezing, or waxing, especially curly hair. Using hair removal creams can sometimes lead to ingrown hairs, especially in the groin.  SYMPTOMS   · Small bumps on the skin. The bumps may be filled with pus.  · Pain.  · Itching.  DIAGNOSIS   Your caregiver can usually tell what is wrong by doing a physical exam.  TREATMENT   If there is a severe infection, your caregiver may prescribe antibiotic medicines. Laser hair removal may also be done to help prevent regrowth of the hair.  HOME CARE INSTRUCTIONS   · Do not shave irritated skin. You may start shaving again once the irritation has gone away.  · If you are prone to ingrown hairs, consider not shaving as much as possible.  · If antibiotics are prescribed, take them as directed. Finish them even if you start to feel better.  · You may use a facial sponge in a gentle circular motion to help dislodge ingrown hairs on the face.  · You may use a hair removal cream weekly, especially on the legs and underarms. Stop using the cream if it irritates your skin. Use caution when using hair removal creams in the groin area.  SHAVING INSTRUCTIONS AFTER TREATMENT  · Shower before shaving. Keep areas to be shaved packed in warm, moist wraps for several minutes before shaving. The warm, moist environment helps soften the hairs and makes ingrown hairs less likely to occur.  · Use thick shaving gels.  · Use a bump fighter razor that cuts hair slightly above the skin level or use an electric shaver with a longer shave setting.  · Shave in the direction of hair growth. Avoid making multiple razor strokes.  · Use  moisturizing lotions after shaving.  Document Released: 04/27/2000 Document Revised: 07/21/2011 Document Reviewed: 04/21/2011  [employer]® Patient Information ©2014 [employer].

## 2013-07-03 NOTE — ED Provider Notes (Signed)
CSN: [id number]     Arrival date & time 07/03/13  1106 History   First MD Initiated Contact with Patient 07/03/13 1122     Chief Complaint  Patient presents with  . Headache     (Consider location/radiation/quality/duration/timing/severity/associated sxs/prior Treatment) HPI Comments: Miguel Robbins is a 33 y.o. male who presents to the Emergency Department complaining of "a painful bump" to the posterior scalp.  States the "bump" has been present for 6 months but became painful this morning.  Reports pain only with palpation.  He denies headache, fever, chills, increased size of the nodule, drainage or neck pain.  He has not taken any medications recently.  Nothing makes the symptoms better.    The history is provided by the patient.    Past Medical History  Diagnosis Date  . Hernia   . Arm fracture, left   . Migraine    Past Surgical History  Procedure Laterality Date  . Hernia repair    . Hernia repair    . Left arm fracture     Family History  Problem Relation Age of Onset  . Hypertension Father   . Diabetes Father   . Hypertension Sister   . Diabetes Sister   . Hypertension Mother   . Hypertension Brother    History  Substance Use Topics  . Smoking status: Current Every Day Smoker -- 0.50 packs/day for 15 years    Types: Cigarettes  . Smokeless tobacco: Never Used  . Alcohol Use: No    Review of Systems  Constitutional: Negative for fever, chills, activity change and appetite change.  HENT: Negative for facial swelling, sore throat and trouble swallowing.   Eyes: Negative for visual disturbance.  Respiratory: Negative for chest tightness, shortness of breath and wheezing.   Musculoskeletal: Negative for neck pain and neck stiffness.  Skin: Negative for wound.       "bump" to the scalp  Neurological: Negative for dizziness, weakness, numbness and headaches.  Hematological: Negative for adenopathy.  All other systems reviewed and are  negative.     Allergies  Penicillins; Bee venom; Clindamycin/lincomycin; and Cranberry  Home Medications   Prior to Admission medications   Medication Sig Start Date End Date Taking? Authorizing Provider  cyclobenzaprine (FLEXERIL) 10 MG tablet Take 10 mg by mouth once.    Historical Provider, MD  diclofenac (VOLTAREN) 75 MG EC tablet Take 1 tablet (75 mg total) by mouth 2 (two) times daily. 11/06/12   Kathie Dike, PA-C  ibuprofen (ADVIL,MOTRIN) 600 MG tablet Take 1 tablet (600 mg total) by mouth every 6 (six) hours as needed for pain. 11/12/12   Juliet Rude. Pickering, MD  oxyCODONE-acetaminophen (PERCOCET/ROXICET) 5-325 MG per tablet Take 1-2 tablets by mouth every 6 (six) hours as needed for pain. 11/12/12   Juliet Rude. Pickering, MD   BP 122/63  Pulse 59  Temp(Src) 98.2 F (36.8 C) (Oral)  Resp 16  Ht 5\' 7"  (1.702 m)  Wt 200 lb (90.719 kg)  BMI 31.32 kg/m2  SpO2 99% Physical Exam  Nursing note and vitals reviewed. Constitutional: He is oriented to person, place, and time. He appears well-developed and well-nourished. No distress.  HENT:  Head: Normocephalic and atraumatic.    Right Ear: Tympanic membrane and ear canal normal.  Left Ear: Tympanic membrane and ear canal normal.  Mouth/Throat: Uvula is midline, oropharynx is clear and moist and mucous membranes are normal.  Pea sized nodule to the scalp.  No fluctuance, erythema or drainage.  Nodule is firm and mobile.    Neck: Normal range of motion. Neck supple.  Cardiovascular: Normal rate, regular rhythm and normal heart sounds.   No murmur heard. Pulmonary/Chest: Effort normal and breath sounds normal. No respiratory distress.  Musculoskeletal: Normal range of motion.  Lymphadenopathy:       Head (right side): Occipital adenopathy present.    He has no cervical adenopathy.  Neurological: He is alert and oriented to person, place, and time. He exhibits normal muscle tone. Coordination normal.  Skin: Skin is warm and  dry.    ED Course  Procedures (including critical care time) Labs Review Labs Reviewed - No data to display  Imaging Review No results found.   EKG Interpretation None      MDM   Final diagnoses:  Folliculitis     Single pea sized nodule to the posterior scalp.  Mild occipital lymphadenopathy.  Pt is otherwise well appearing, non-toxic.  VSS.  No meningeal signs.  Possible early folliculitis.  Pt agrees to doxycycline and to return here in 2-3 days if not improving.  Advised to take OTC ibuprofen if needed for pain.  Pt agrees to plan and appears stable for d/c   Lalitha Ilyas L. Florine Sprenkle, PA-C 07/04/13 1309

## 2013-07-03 NOTE — ED Notes (Signed)
Patient with no complaints at this time. Respirations even and unlabored. Skin warm/dry. Discharge instructions reviewed with patient at this time. Patient given opportunity to voice concerns/ask questions. Patient discharged at this time and left Emergency Department with steady gait.   

## 2013-07-03 NOTE — ED Notes (Signed)
Pt c/o scalp pain since this morning, bump to scalp for 6-7 months, pt denies fever or drainage

## 2013-07-04 NOTE — ED Provider Notes (Signed)
Medical screening examination/treatment/procedure(s) were performed by non-physician practitioner and as supervising physician I was immediately available for consultation/collaboration.   EKG Interpretation None       Jakeria Caissie R. Torria Fromer, MD 07/04/13 1328 

## 2013-07-31 ENCOUNTER — Emergency Department (HOSPITAL_COMMUNITY)
Admission: EM | Admit: 2013-07-31 | Discharge: 2013-07-31 | Disposition: A | Discharge status: Home / Self Care | Payer: Self-pay | Attending: Emergency Medicine | Admitting: Emergency Medicine

## 2013-07-31 ENCOUNTER — Encounter (HOSPITAL_COMMUNITY): Payer: Self-pay | Admitting: Emergency Medicine

## 2013-07-31 DIAGNOSIS — Z8679 Personal history of other diseases of the circulatory system: Secondary | ICD-10-CM | POA: Insufficient documentation

## 2013-07-31 DIAGNOSIS — Z8719 Personal history of other diseases of the digestive system: Secondary | ICD-10-CM | POA: Insufficient documentation

## 2013-07-31 DIAGNOSIS — Z880 Allergy status to penicillin: Secondary | ICD-10-CM | POA: Insufficient documentation

## 2013-07-31 DIAGNOSIS — T2220XA Burn of second degree of shoulder and upper limb, except wrist and hand, unspecified site, initial encounter: Secondary | ICD-10-CM | POA: Insufficient documentation

## 2013-07-31 DIAGNOSIS — Z8781 Personal history of (healed) traumatic fracture: Secondary | ICD-10-CM | POA: Insufficient documentation

## 2013-07-31 DIAGNOSIS — X19XXXA Contact with other heat and hot substances, initial encounter: Secondary | ICD-10-CM | POA: Insufficient documentation

## 2013-07-31 DIAGNOSIS — F17200 Nicotine dependence, unspecified, uncomplicated: Secondary | ICD-10-CM | POA: Insufficient documentation

## 2013-07-31 DIAGNOSIS — Y9389 Activity, other specified: Secondary | ICD-10-CM | POA: Insufficient documentation

## 2013-07-31 DIAGNOSIS — Y929 Unspecified place or not applicable: Secondary | ICD-10-CM | POA: Insufficient documentation

## 2013-07-31 DIAGNOSIS — Z792 Long term (current) use of antibiotics: Secondary | ICD-10-CM | POA: Insufficient documentation

## 2013-07-31 MED ORDER — BACITRACIN-NEOMYCIN-POLYMYXIN 400-5-5000 EX OINT
TOPICAL_OINTMENT | Freq: Once | CUTANEOUS | Status: AC
  Administered 2013-07-31: 12:00:00 via TOPICAL
  Filled 2013-07-31: qty 1
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
## 2013-07-31 NOTE — Discharge Instructions (Signed)
Please clean your wound gently with soap and water. Please apply a bandage until the wound is completely healed. Please see your primary physician or return to the emergency department if any red streaks, or excessive swelling, or high fevers. Burn Care Burns hurt your skin. When your skin is hurt, it is easier to get an infection. Follow your doctor's directions to help prevent an infection. HOME CARE  Wash your hands well before you change your bandage.  Change your bandage as often as told by your doctor.  Remove the old bandage. If the bandage sticks, soak it off with cool, clean water.  Gently clean the burn with mild soap and water.  Pat the burn dry with a clean, dry cloth.  Put a thin layer of medicated cream on the burn.  Put a clean bandage on as told by your doctor.  Keep the bandage clean and dry.  Raise (elevate) the burn for the first 24 hours. After that, follow your doctor's directions.  Only take medicine as told by your doctor. GET HELP RIGHT AWAY IF:   You have too much pain.  The skin near the burn is red, tender, puffy (swollen), or has red streaks.  The burn area has yellowish white fluid (pus) or a bad smell coming from it.  You have a fever. MAKE SURE YOU:   Understand these instructions.  Will watch your condition.  Will get help right away if you are not doing well or get worse. Document Released: 10/29/2007 Document Revised: 04/13/2011 Document Reviewed: 06/11/2010 [facility] Patient Information 2015 [employer], [place]. This information is not intended to replace advice given to you by your health care provider. Make sure you discuss any questions you have with your health care provider.
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
## 2013-08-01 NOTE — ED Provider Notes (Signed)
Medical screening examination/treatment/procedure(s) were performed by non-physician practitioner and as supervising physician I was immediately available for consultation/collaboration.   EKG Interpretation None        Scott Zackowski, MD 08/01/13 0826 

## 2013-08-10 ENCOUNTER — Emergency Department (HOSPITAL_COMMUNITY): Payer: Self-pay

## 2013-08-10 ENCOUNTER — Encounter (HOSPITAL_COMMUNITY): Payer: Self-pay | Admitting: Emergency Medicine

## 2013-08-10 ENCOUNTER — Emergency Department (HOSPITAL_COMMUNITY)
Admission: EM | Admit: 2013-08-10 | Discharge: 2013-08-10 | Disposition: A | Discharge status: Home / Self Care | Payer: Self-pay | Attending: Emergency Medicine | Admitting: Emergency Medicine

## 2013-08-10 DIAGNOSIS — M5430 Sciatica, unspecified side: Secondary | ICD-10-CM | POA: Insufficient documentation

## 2013-08-10 DIAGNOSIS — G43909 Migraine, unspecified, not intractable, without status migrainosus: Secondary | ICD-10-CM | POA: Insufficient documentation

## 2013-08-10 DIAGNOSIS — K469 Unspecified abdominal hernia without obstruction or gangrene: Secondary | ICD-10-CM | POA: Insufficient documentation

## 2013-08-10 DIAGNOSIS — IMO0002 Reserved for concepts with insufficient information to code with codable children: Secondary | ICD-10-CM | POA: Insufficient documentation

## 2013-08-10 DIAGNOSIS — F17200 Nicotine dependence, unspecified, uncomplicated: Secondary | ICD-10-CM | POA: Insufficient documentation

## 2013-08-10 DIAGNOSIS — Z881 Allergy status to other antibiotic agents status: Secondary | ICD-10-CM | POA: Insufficient documentation

## 2013-08-10 DIAGNOSIS — Z79899 Other long term (current) drug therapy: Secondary | ICD-10-CM | POA: Insufficient documentation

## 2013-08-10 DIAGNOSIS — M5431 Sciatica, right side: Secondary | ICD-10-CM

## 2013-08-10 DIAGNOSIS — Z880 Allergy status to penicillin: Secondary | ICD-10-CM | POA: Insufficient documentation

## 2013-08-10 MED ORDER — PREDNISONE 50 MG PO TABS
60.0000 mg | ORAL_TABLET | Freq: Once | ORAL | Status: AC
  Administered 2013-08-10: 13:00:00 60 mg via ORAL
  Filled 2013-08-10 (×2): qty 1

## 2013-08-10 MED ORDER — CYCLOBENZAPRINE HCL 5 MG PO TABS: 5.0000 mg | ORAL_TABLET | Freq: Three times a day (TID) | ORAL | Status: DC | PRN

## 2013-08-10 MED ORDER — KETOROLAC TROMETHAMINE 60 MG/2ML IM SOLN
60.0000 mg | Freq: Once | INTRAMUSCULAR | Status: AC
  Administered 2013-08-10: 60 mg via INTRAMUSCULAR
  Filled 2013-08-10: qty 2

## 2013-08-10 MED ORDER — CYCLOBENZAPRINE HCL 10 MG PO TABS
10.0000 mg | ORAL_TABLET | Freq: Once | ORAL | Status: AC
  Administered 2013-08-10: 10 mg via ORAL
  Filled 2013-08-10: qty 1

## 2013-08-10 MED ORDER — PREDNISONE 10 MG PO TABS: ORAL_TABLET | ORAL | Status: DC

## 2013-08-10 NOTE — ED Notes (Signed)
Pt states lower back pain radiating down right leg.

## 2013-08-10 NOTE — Discharge Instructions (Signed)
Sciatica °Sciatica is pain, weakness, numbness, or tingling along the path of the sciatic nerve. The nerve starts in the lower back and runs down the back of each leg. The nerve controls the muscles in the lower leg and in the back of the knee, while also providing sensation to the back of the thigh, lower leg, and the sole of your foot. Sciatica is a symptom of another medical condition. For instance, nerve damage or certain conditions, such as a herniated disk or bone spur on the spine, pinch or put pressure on the sciatic nerve. This causes the pain, weakness, or other sensations normally associated with sciatica. Generally, sciatica only affects one side of the body. °CAUSES  °· Herniated or slipped disc. °· Degenerative disk disease. °· A pain disorder involving the narrow muscle in the buttocks (piriformis syndrome). °· Pelvic injury or fracture. °· Pregnancy. °· Tumor (rare). °SYMPTOMS  °Symptoms can vary from mild to very severe. The symptoms usually travel from the low back to the buttocks and down the back of the leg. Symptoms can include: °· Mild tingling or dull aches in the lower back, leg, or hip. °· Numbness in the back of the calf or sole of the foot. °· Burning sensations in the lower back, leg, or hip. °· Sharp pains in the lower back, leg, or hip. °· Leg weakness. °· Severe back pain inhibiting movement. °These symptoms may get worse with coughing, sneezing, laughing, or prolonged sitting or standing. Also, being overweight may worsen symptoms. °DIAGNOSIS  °Your caregiver will perform a physical exam to look for common symptoms of sciatica. He or she may ask you to do certain movements or activities that would trigger sciatic nerve pain. Other tests may be performed to find the cause of the sciatica. These may include: °· Blood tests. °· X-rays. °· Imaging tests, such as an MRI or CT scan. °TREATMENT  °Treatment is directed at the cause of the sciatic pain. Sometimes, treatment is not necessary  and the pain and discomfort goes away on its own. If treatment is needed, your caregiver may suggest: °· Over-the-counter medicines to relieve pain. °· Prescription medicines, such as anti-inflammatory medicine, muscle relaxants, or narcotics. °· Applying heat or ice to the painful area. °· Steroid injections to lessen pain, irritation, and inflammation around the nerve. °· Reducing activity during periods of pain. °· Exercising and stretching to strengthen your abdomen and improve flexibility of your spine. Your caregiver may suggest losing weight if the extra weight makes the back pain worse. °· Physical therapy. °· Surgery to eliminate what is pressing or pinching the nerve, such as a bone spur or part of a herniated disk. °HOME CARE INSTRUCTIONS  °· Only take over-the-counter or prescription medicines for pain or discomfort as directed by your caregiver. °· Apply ice to the affected area for 20 minutes, 3-4 times a day for the first 48-72 hours. Then try heat in the same way. °· Exercise, stretch, or perform your usual activities if these do not aggravate your pain. °· Attend physical therapy sessions as directed by your caregiver. °· Keep all follow-up appointments as directed by your caregiver. °· Do not wear high heels or shoes that do not provide proper support. °· Check your mattress to see if it is too soft. A firm mattress may lessen your pain and discomfort. °SEEK IMMEDIATE MEDICAL CARE IF:  °· You lose control of your bowel or bladder (incontinence). °· You have increasing weakness in the lower back, pelvis, buttocks,   or legs. °· You have redness or swelling of your back. °· You have a burning sensation when you urinate. °· You have pain that gets worse when you lie down or awakens you at night. °· Your pain is worse than you have experienced in the past. °· Your pain is lasting longer than 4 weeks. °· You are suddenly losing weight without reason. °MAKE SURE YOU: °· Understand these  instructions. °· Will watch your condition. °· Will get help right away if you are not doing well or get worse. °Document Released: 01/13/2001 Document Revised: 07/21/2011 Document Reviewed: 05/31/2011 °ExitCare® Patient Information ©2015 [employer]. This information is not intended to replace advice given to you by your health care provider. Make sure you discuss any questions you have with your health care provider. ° °Emergency Department Resource Guide °1) Find a Doctor and Pay Out of Pocket °Although you won't have to find out who is covered by your insurance plan, it is a good idea to ask around and get recommendations. You will then need to call the office and see if the doctor you have chosen will accept you as a new patient and what types of options they offer for patients who are self-pay. Some doctors offer discounts or will set up payment plans for their patients who do not have insurance, but you will need to ask so you aren't surprised when you get to your appointment. ° °2) Contact Your Local Health Department °Not all health departments have doctors that can see patients for sick visits, but many do, so it is worth a call to see if yours does. If you don't know where your local health department is, you can check in your phone book. The CDC also has a tool to help you locate your state's health department, and many state websites also have listings of all of their local health departments. ° °3) Find a Walk-in Clinic °If your illness is not likely to be very severe or complicated, you may want to try a walk in clinic. These are popping up all over the country in pharmacies, drugstores, and shopping centers. They're usually staffed by nurse practitioners or physician assistants that have been trained to treat common illnesses and complaints. They're usually fairly quick and inexpensive. However, if you have serious medical issues or chronic medical problems, these are probably not your best  option. ° °No Primary Care Doctor: °- Call Health Connect at  832-8000 - they can help you locate a primary care doctor that  accepts your insurance, provides certain services, etc. °- Physician Referral Service- 1-800-533-3463 ° °Chronic Pain Problems: °Organization         Address  Phone   Notes  °[facility]  (336) [phone] Patients need to be referred by their primary care doctor.  ° °Medication Assistance: °Organization         Address  Phone   Notes  °[place] [facility] [address] °[place], [place] [address] [phone]) 641-8030 --Must be a resident of [place] °-- Must have NO insurance coverage whatsoever (no Medicaid/ Medicare, etc.) °-- The pt. MUST have a primary care doctor that directs their care regularly and follows them in the community °  °[facility]  (866) 331-1348   °United Way  (888) 892-1162   ° °Agencies that provide inexpensive medical care: °Organization         Address  Phone   Notes  °[facility]  (  ([phone]   [facility]    [phone]   [facility] [place], [place] [address] [phone]   [place] [address], [place] [phone]   Planned Parenthood    [phone]   [facility]    [phone]   [place] [address] [place] [address], [place] Phone:  [phone], Fax:  [phone] Hours of Operation:  9 am - 6 pm, M-F.  Also accepts Medicaid/Medicare and self-pay.  [facility] for [place] [address], [place] Phone: [phone], Fax: [phone]. Hours of Operation:  8:30 am - 5:30 pm, M-F.  Also accepts Medicaid and self-pay.  [facility] [place] [address] [place] Phone: [phone]   [place], [place], [place] [phone], Ext. [phone] Mondays & Thursdays: 7-9 AM.  First 15  patients are seen on a first come, first serve basis.    [place] Providers:  Organization         Address  Phone   Notes  [facility] [address], [place] [phone] Also accepts self-pay patients.  [facility] [id number] [place], [place]  [phone]   [place], [address], [place] [phone]   [facility] [address], [place] [phone]   Lucianne Lei [address], [place]   [phone] Only accepts [place] Access [place] patients after they have their name applied to their card.   Self-Pay (no insurance) in [facility]:  Organization         Address  Phone   Notes  Sickle Cell Patients, [facility] [place] [phone]   [facility] [place] [phone]   [facility] [place]  [place], [place], [place] [phone]   [facility]/Dr. Osei-Bonsu  [address] [place] or [address], [place] [phone] Phone number for both [place] and [place] locations is the same.  [facility] [address] [place] [phone]   [facility] [place] [address], [place] or [address] [phone]   [facility] [address] [place] [phone], phone; [phone], fax Sees patients 1st and 3rd Saturday of every month.  Must not qualify for public or private insurance (i.e. Medicaid, Medicare, [place] Health Choice, Veterans' Benefits)  Household income should be no more than 200% of the poverty level The clinic cannot treat you if you are pregnant or think you are pregnant  Sexually transmitted diseases are not treated at the clinic.    Dental  Care: Organization         Address  Phone  Notes  [facility] [place] [phone] Accepts children up to age 64 who are enrolled in [place] or [place]; pregnant women with a Medicaid card; and children who have applied for Medicaid or [place] Health Choice, but were declined, whose parents can pay a reduced fee at time of service.  [facility] [place]  [address], [place] ([phone]) [phone] Accepts children up to age 21 who are enrolled in Medicaid or [place] Health Choice; pregnant women with a Medicaid card; and children who have applied for Medicaid or [place] Health Choice, but were declined, whose parents can pay a reduced fee at time of service.  °[facility] ° [address], [place] ([phone]) [phone] Patients are seen by appointment only. Walk-ins are not accepted. [facility] will see patients 18 years of age and older. °Monday - Tuesday (8am-5pm) °Most Wednesdays (8:30-5pm) °$30 per visit, cash only  °[facility] ° [address], [place] ([phone]) [phone] Patients are seen by appointment only. Walk-ins are not accepted. [facility] will see patients 18 years of age and older. °One Wednesday Evening (Monthly: Volunteer Based).  $30 per visit, cash only  °[facility]  ([phone] for adults; Children under age 4, call Graduate Pediatric Dentistry at [phone]. Children aged 4-14, please call [phone] to request a pediatric application. ° Dental services are provided in all areas of dental care including fillings, crowns and bridges, complete and partial dentures, implants, gum treatment, root canals, and extractions. Preventive care is also provided. Treatment is provided to both adults and children. °Patients are selected via a lottery and there is often a waiting list. °  °[facility] [address], °[place] ° (336) 763-8833 [email or web address] °  °[facility] [address], [place], [place] ([phone], Ext. 123 Second and Fourth Thursday of each month, opens at 6:30 AM; Clinic ends at 9 AM.  Patients are seen on a first-come first-served basis, and a limited number are seen during each clinic.  ° °[facility] ° [address], [place], [place] ([phone]) [phone]   Eligibility Requirements °You must have lived in [place], [place], or [place] counties for at least the last three months. °  You cannot be eligible for state or federal sponsored healthcare insurance, including Veterans Administration, Medicaid, or Medicare. °  You generally cannot be eligible for healthcare insurance through your employer.  °  How to apply: °Eligibility screenings are held every Tuesday and Wednesday afternoon from 1:00 pm until 4:00 pm. You do not need an appointment for the interview!  °[facility] [address], [place], [place] [phone]   °[place] [facility]  [phone]   °[place] [facility]  [phone]   °[place]  336-570-6415   ° °Behavioral Health Resources in the Community: °Intensive Outpatient Programs °Organization         Address  Phone  Notes  °[facility] [address], [place], [place] [phone]   °[facility] [address], [place], [place] [phone]   °[facility] [address], [place], [place] ° [phone]   °[place] [address],  °[place],  1-800-853-5163 or 336-641-4981   °Substance Abuse Resources °Organization         Address  Phone  Notes  °[facility]  336-882-2125   °[facility]  336-784-9470   °[facility]  336-285-9073   °[facility]  336-845-3988   °Residential & Outpatient Substance Abuse Program  1-800-659-3381    °Psychological Services °Organization         Address  Phone  Notes  °[facility]  336- [phone]   °  [facility]  [phone]- [phone]   [facility] [address], [place] [phone] or [phone]    Mobile Crisis Teams Organization         Address  Phone  Notes  [facility], Mobile Crisis Care Unit  [phone]   [facility]  [address] [place], [place] [phone]   Doristine LocksSharon DeEsch [address] [place] [phone]    Self-Help/Support Groups Organization         Address  Phone             Notes  [facility] [place] - variety of support groups  336- [phone]) [phone] Call for more information  Narcotics Anonymous (NA), [facility] [address], [place]  2 meetings at this location   [facility] Treatment Programs Organization         Address  Phone  Notes  [facility] [address],    [place]  [phone]   [facility]  [address], [place] [phone], [place], [place] [phone]   [facility] [address], [place] [phone] Admissions: 8am-3pm M-F  [facility] [address]    [place], [place] [phone]   [facility] [address], [place], [place] [phone]   [facility] [address]  [place], [place] [phone]   [facility] - Intensive Outpatient [address], [place], [place] [phone]   [facility] [address]  [place], [place] [phone] or [phone]   [facility] ([facility]) [address] [place], [place] [phone] Accepts Medicaid  [facility] [address]  [place] [phone] Substance Abuse/Addiction Treatment   [facility] Resources Organization         Address  Phone  Notes  [facility]  ([phone]([phone]) [phone]   Angie FavaJulie Brannon, PhD [address], [address] [place], [place]   [phone] or [phone]) [phone]   [facility]   [address] [place], [place] [phone](336) [phone]   [facility] [address] [place] [address], [place], [place] [phone]) [phone] Insurance/Medicaid/sponsorship through [facility]  [facility] [address]                                    [place], [place] [phone]) [phone] Therapy/tele-psych/case  [facility] [address]   [place], [place] [phone]) [phone]    Dr. Lolly MustacheArfeen  [phone]) [phone]   [facility] [place]  [employer] [facility] 1) [address], [place] 2) [address], [place] 3)  [address], [place] [phone](336) [phone](336) [phone]  [phone]) [phone]   [facility] Child Abuse Hotline (551) 831-3170(336) (380)540-5978 or [phone](336) [phone])[phone] (After Hours)         Your x-rays are negative for any acute injury today.  Use a heating pad applied to your lower back for 20 minutes 3-4 times daily.  Avoid activity that worsens your symptoms including lifting bending or twisting.  Take the prednisone in place of your ibuprofen taking your first prescription dose of this tomorrow as you've received today's dose while here.  Also used the muscle relaxer as prescribed.  Use caution as this medication as it may make you sleepy.  Get rechecked for any worsened symptoms including any  weakness or loss of control of bowels or bladder.

## 2013-08-10 NOTE — ED Notes (Signed)
Pt has been having off and on pain in lower back since January. States that sometimes his legs "just give out" and he "cannot walk". States it hurts to sit or lie down.

## 2013-08-11 NOTE — ED Provider Notes (Signed)
Medical screening examination/treatment/procedure(s) were performed by non-physician practitioner and as supervising physician I was immediately available for consultation/collaboration.   EKG Interpretation None       Donnetta HutchingBrian Kenard Morawski, MD 08/11/13 1221

## 2013-08-11 NOTE — ED Provider Notes (Signed)
CSN: [id number]     Arrival date & time 08/10/13  1042 History   First MD Initiated Contact with Patient 08/10/13 1047     Chief Complaint  Patient presents with  . Back Pain     (Consider location/radiation/quality/duration/timing/severity/associated sxs/prior Treatment) HPI  Miguel Robbins is a 33 y.o. male presenting with intermittent low back pain for the past 6 months.    Patient denies any new injury specifically.  There is radiation of pain into his right lateral thigh.  There has been no weakness or numbness in the lower extremities and no urinary or bowel retention or incontinence.  Patient does not have a history of cancer or IVDU.  The patient has tried the followed medicines and/or treatments without relief of pain: ibuprofen.     Past Medical History  Diagnosis Date  . Hernia   . Arm fracture, left   . Migraine    Past Surgical History  Procedure Laterality Date  . Hernia repair    . Hernia repair    . Left arm fracture     Family History  Problem Relation Age of Onset  . Hypertension Father   . Diabetes Father   . Hypertension Sister   . Diabetes Sister   . Hypertension Mother   . Hypertension Brother    History  Substance Use Topics  . Smoking status: Current Every Day Smoker -- 0.50 packs/day for 15 years    Types: Cigarettes  . Smokeless tobacco: Never Used  . Alcohol Use: Yes    Review of Systems  Constitutional: Negative for fever.  Respiratory: Negative for shortness of breath.   Cardiovascular: Negative for chest pain and leg swelling.  Gastrointestinal: Negative for abdominal pain, constipation and abdominal distention.  Genitourinary: Negative for dysuria, urgency, frequency, flank pain and difficulty urinating.  Musculoskeletal: Positive for back pain. Negative for gait problem and joint swelling.  Skin: Negative for rash.  Neurological: Negative for weakness and numbness.      Allergies  Penicillins; Bee venom;  Clindamycin/lincomycin; and Cranberry  Home Medications   Prior to Admission medications   Medication Sig Start Date End Date Taking? Authorizing Provider  ibuprofen (ADVIL,MOTRIN) 200 MG tablet Take 400 mg by mouth every 6 (six) hours as needed for moderate pain.   Yes Historical Provider, MD  cyclobenzaprine (FLEXERIL) 5 MG tablet Take 1 tablet (5 mg total) by mouth 3 (three) times daily as needed for muscle spasms. 08/10/13   Burgess Amor, PA-C  predniSONE (DELTASONE) 10 MG tablet 6, 5, 4, 3, 2 then 1 tablet by mouth daily for 6 days total. 08/10/13   Burgess Amor, PA-C   BP 122/68  Pulse 73  Temp(Src) 97.2 F (36.2 C) (Oral)  SpO2 99% Physical Exam  Nursing note and vitals reviewed. Constitutional: He appears well-developed and well-nourished.  HENT:  Head: Normocephalic.  Eyes: Conjunctivae are normal.  Neck: Normal range of motion. Neck supple.  Cardiovascular: Normal rate and intact distal pulses.   Pedal pulses normal.  Pulmonary/Chest: Effort normal.  Abdominal: Soft. Bowel sounds are normal. He exhibits no distension and no mass.  Musculoskeletal: Normal range of motion. He exhibits no edema.       Lumbar back: He exhibits tenderness. He exhibits no swelling, no edema and no spasm.  Neurological: He is alert. He has normal strength. He displays no atrophy and no tremor. No sensory deficit. Gait normal.  Reflex Scores:      Patellar reflexes are 2+ on the right side  and 2+ on the left side.      Achilles reflexes are 2+ on the right side and 2+ on the left side. No strength deficit noted in hip and knee flexor and extensor muscle groups.  Ankle flexion and extension intact.  Skin: Skin is warm and dry.  Psychiatric: He has a normal mood and affect.    ED Course  Procedures (including critical care time) Labs Review Labs Reviewed - No data to display  Imaging Review Dg Lumbar Spine Complete  08/10/2013   CLINICAL DATA:  Low back pain  EXAM: LUMBAR SPINE - COMPLETE 4+ VIEW   COMPARISON:  03/11/2011.  FINDINGS: There is no evidence of lumbar spine fracture. Alignment is normal. Intervertebral disc spaces are maintained.  IMPRESSION: Normal exam.   Electronically Signed   By: Kennith CenterEric  Mansell M.D.   On: 08/10/2013 12:17     EKG Interpretation None      MDM   Final diagnoses:  Sciatica associated with disorder of lumbar spine, right    No neuro deficit on exam or by history to suggest emergent or surgical presentation.  Also discussed worsened sx that should prompt immediate re-evaluation including distal weakness, bowel/bladder retention/incontinence.  Pt was prescribed flexeril,  Prednisone taper. Heat tx,  Advised activity as tolerated, caution with lifting,  Bending.  Referrals given for obtaining pcp.        Burgess AmorJulie Hannia Matchett, PA-C 08/11/13 [phone]

## 2013-09-04 ENCOUNTER — Encounter (HOSPITAL_COMMUNITY): Payer: Self-pay | Admitting: Emergency Medicine

## 2013-09-04 ENCOUNTER — Emergency Department (HOSPITAL_COMMUNITY)
Admission: EM | Admit: 2013-09-04 | Discharge: 2013-09-04 | Disposition: A | Discharge status: Home / Self Care | Payer: Self-pay | Attending: Emergency Medicine | Admitting: Emergency Medicine

## 2013-09-04 DIAGNOSIS — Z8719 Personal history of other diseases of the digestive system: Secondary | ICD-10-CM | POA: Insufficient documentation

## 2013-09-04 DIAGNOSIS — L723 Sebaceous cyst: Secondary | ICD-10-CM | POA: Insufficient documentation

## 2013-09-04 DIAGNOSIS — F17200 Nicotine dependence, unspecified, uncomplicated: Secondary | ICD-10-CM | POA: Insufficient documentation

## 2013-09-04 DIAGNOSIS — Z791 Long term (current) use of non-steroidal anti-inflammatories (NSAID): Secondary | ICD-10-CM | POA: Insufficient documentation

## 2013-09-04 DIAGNOSIS — L738 Other specified follicular disorders: Secondary | ICD-10-CM | POA: Insufficient documentation

## 2013-09-04 DIAGNOSIS — IMO0002 Reserved for concepts with insufficient information to code with codable children: Secondary | ICD-10-CM | POA: Insufficient documentation

## 2013-09-04 DIAGNOSIS — G43909 Migraine, unspecified, not intractable, without status migrainosus: Secondary | ICD-10-CM | POA: Insufficient documentation

## 2013-09-04 DIAGNOSIS — R238 Other skin changes: Secondary | ICD-10-CM

## 2013-09-04 DIAGNOSIS — Z880 Allergy status to penicillin: Secondary | ICD-10-CM | POA: Insufficient documentation

## 2013-09-04 MED ORDER — DOXYCYCLINE HYCLATE 100 MG PO CAPS: 100.0000 mg | ORAL_CAPSULE | Freq: Two times a day (BID) | ORAL | Status: DC

## 2013-09-04 MED ORDER — MUPIROCIN 2 % EX OINT: TOPICAL_OINTMENT | CUTANEOUS | Status: DC

## 2013-09-04 NOTE — ED Notes (Signed)
Pt c/o small cyst to back of head x 3 years, states over the past 3 days it has become larger and more painful. No erythema/edema noted. nad noted.

## 2013-09-04 NOTE — ED Provider Notes (Signed)
CSN: [id number]     Arrival date & time 09/04/13  0914 History   First MD Initiated Contact with Patient 09/04/13 [phone]     Chief Complaint  Patient presents with  . Cyst     (Consider location/radiation/quality/duration/timing/severity/associated sxs/prior Treatment) The history is provided by the patient.  Miguel Robbins is a 33 y.o. male who presents to the Emergency Department complaining of painful "knot" to the back of his scalp for 3-4 years.  He states the area is now painful and increasing in size.  He was seen here several months ago for same.  He has not followed-up with anyone.  He denies fever, chills, neck pain, headaches or visual changes. He has not tried any home therapies or OTC medications.    Past Medical History  Diagnosis Date  . Hernia   . Arm fracture, left   . Migraine    Past Surgical History  Procedure Laterality Date  . Hernia repair    . Hernia repair    . Left arm fracture     Family History  Problem Relation Age of Onset  . Hypertension Father   . Diabetes Father   . Hypertension Sister   . Diabetes Sister   . Hypertension Mother   . Hypertension Brother    History  Substance Use Topics  . Smoking status: Current Every Day Smoker -- 0.50 packs/day for 15 years    Types: Cigarettes  . Smokeless tobacco: Never Used  . Alcohol Use: Yes    Review of Systems  Constitutional: Negative for fever and chills.  Eyes: Negative for visual disturbance.  Gastrointestinal: Negative for nausea and vomiting.  Musculoskeletal: Negative for arthralgias and joint swelling.  Skin: Positive for color change.       "knot" to the scalp  Neurological: Negative for dizziness, facial asymmetry, light-headedness and headaches.  Hematological: Negative for adenopathy.  All other systems reviewed and are negative.     Allergies  Penicillins; Bee venom; Clindamycin/lincomycin; and Cranberry  Home Medications   Prior to Admission medications   Medication  Sig Start Date End Date Taking? Authorizing Provider  cyclobenzaprine (FLEXERIL) 5 MG tablet Take 1 tablet (5 mg total) by mouth 3 (three) times daily as needed for muscle spasms. 08/10/13   Burgess Amor, PA-C  ibuprofen (ADVIL,MOTRIN) 200 MG tablet Take 400 mg by mouth every 6 (six) hours as needed for moderate pain.    Historical Provider, MD  predniSONE (DELTASONE) 10 MG tablet 6, 5, 4, 3, 2 then 1 tablet by mouth daily for 6 days total. 08/10/13   Burgess Amor, PA-C   BP 133/83  Pulse 57  Temp(Src) 98.3 F (36.8 C)  Resp 18  Ht 5\' 7"  (1.702 m)  Wt 194 lb (87.998 kg)  BMI 30.38 kg/m2  SpO2 99% Physical Exam  Nursing note and vitals reviewed. Constitutional: He is oriented to person, place, and time. He appears well-developed and well-nourished. No distress.  HENT:  Head: Normocephalic and atraumatic.  Eyes: Conjunctivae are normal. Pupils are equal, round, and reactive to light.  Neck: Normal range of motion. Neck supple.  Cardiovascular: Normal rate, regular rhythm, normal heart sounds and intact distal pulses.   No murmur heard. Pulmonary/Chest: Effort normal and breath sounds normal. No respiratory distress.  Musculoskeletal: Normal range of motion.  Lymphadenopathy:    He has no cervical adenopathy.  Neurological: He is alert and oriented to person, place, and time. He exhibits normal muscle tone. Coordination normal.  Skin: Skin is  warm and dry. No erythema.  3 mm indurated flesh colored papule to the right posterior scalp.  No surrounding erythema or edema.  No fluctuance or drainage    ED Course  Procedures (including critical care time) Labs Review Labs Reviewed - No data to display  Imaging Review No results found.   EKG Interpretation None      MDM   Final diagnoses:  Perifollicular papule    Pt well appearing.  Has a small, 1 mm papule to the posterior scalp.  Seen here previously for same. No PMD f/u. Will treat with doxy and bactroban oint.  Pt requesting  note for work stating he had to leave to come to ED.   VSS, he appears stable for d/c    Shawnette Augello L. Trisha Mangleriplett, PA-C 09/06/13 1628

## 2013-09-07 NOTE — ED Provider Notes (Signed)
Medical screening examination/treatment/procedure(s) were performed by non-physician practitioner and as supervising physician I was immediately available for consultation/collaboration.   EKG Interpretation None        Courtney F Horton, MD 09/07/13 1800 

## 2013-09-10 ENCOUNTER — Encounter (HOSPITAL_COMMUNITY): Payer: Self-pay | Admitting: Emergency Medicine

## 2013-09-10 ENCOUNTER — Emergency Department (HOSPITAL_COMMUNITY)
Admission: EM | Admit: 2013-09-10 | Discharge: 2013-09-10 | Disposition: A | Discharge status: Home / Self Care | Payer: Self-pay | Attending: Emergency Medicine | Admitting: Emergency Medicine

## 2013-09-10 DIAGNOSIS — Z8781 Personal history of (healed) traumatic fracture: Secondary | ICD-10-CM | POA: Insufficient documentation

## 2013-09-10 DIAGNOSIS — K029 Dental caries, unspecified: Secondary | ICD-10-CM | POA: Insufficient documentation

## 2013-09-10 DIAGNOSIS — K0889 Other specified disorders of teeth and supporting structures: Secondary | ICD-10-CM

## 2013-09-10 DIAGNOSIS — F17200 Nicotine dependence, unspecified, uncomplicated: Secondary | ICD-10-CM | POA: Insufficient documentation

## 2013-09-10 DIAGNOSIS — Z79899 Other long term (current) drug therapy: Secondary | ICD-10-CM | POA: Insufficient documentation

## 2013-09-10 DIAGNOSIS — K089 Disorder of teeth and supporting structures, unspecified: Secondary | ICD-10-CM | POA: Insufficient documentation

## 2013-09-10 DIAGNOSIS — Z880 Allergy status to penicillin: Secondary | ICD-10-CM | POA: Insufficient documentation

## 2013-09-10 DIAGNOSIS — Z8679 Personal history of other diseases of the circulatory system: Secondary | ICD-10-CM | POA: Insufficient documentation

## 2013-09-10 DIAGNOSIS — Z8719 Personal history of other diseases of the digestive system: Secondary | ICD-10-CM | POA: Insufficient documentation

## 2013-09-10 MED ORDER — TRAMADOL HCL 50 MG PO TABS: 50.0000 mg | ORAL_TABLET | Freq: Four times a day (QID) | ORAL | Status: DC | PRN

## 2013-09-10 MED ORDER — TRAMADOL HCL 50 MG PO TABS
50.0000 mg | ORAL_TABLET | Freq: Once | ORAL | Status: AC
  Administered 2013-09-10: 50 mg via ORAL
  Filled 2013-09-10: qty 1

## 2013-09-10 NOTE — ED Notes (Signed)
NAD noted at time of d/c inst 

## 2013-09-10 NOTE — ED Notes (Signed)
Dental pain top left molar. Has not f/u with dentist since last ED visit

## 2013-09-10 NOTE — ED Provider Notes (Signed)
CSN: [id number]     Arrival date & time 09/10/13  1053 History  This chart was scribed for non-physician practitioner, Burgess Amor, PA-C,working with Donnetta Hutching, MD, by Karle Plumber, ED Scribe. This patient was seen in room APFT23/APFT23 and the patient's care was started at 12:07 PM.  Chief Complaint  Patient presents with  . Dental Pain   The history is provided by the patient. No language interpreter was used.   HPI Comments:  Miguel Robbins is a 33 y.o. male who presents to the Emergency Department complaining of worsening, severe upper left throbbing dental pain onset five days ago. Pt states the pain started suddenly and now radiates down in to his bottom teeth. He states he took one Ibuprofen with minimal relief. He reports fever six days ago Tmax 99.5 degrees that has since resolved. He denies recent fever, chills, nausea or vomiting. Pt reports allergies to PCN with a reaction of throat swelling and Clindamycin with a reaction of itching. Pt states his last dental visit was four years ago but cannot remember the name of the dentist.  Past Medical History  Diagnosis Date  . Hernia   . Arm fracture, left   . Migraine    Past Surgical History  Procedure Laterality Date  . Hernia repair    . Hernia repair    . Left arm fracture     Family History  Problem Relation Age of Onset  . Hypertension Father   . Diabetes Father   . Hypertension Sister   . Diabetes Sister   . Hypertension Mother   . Hypertension Brother    History  Substance Use Topics  . Smoking status: Current Every Day Smoker -- 0.50 packs/day for 15 years    Types: Cigarettes  . Smokeless tobacco: Never Used  . Alcohol Use: Yes    Review of Systems  Constitutional: Negative for fever and chills.  HENT: Positive for dental problem. Negative for facial swelling and sore throat.   Respiratory: Negative for shortness of breath.   Gastrointestinal: Negative for nausea and vomiting.  Musculoskeletal:  Negative for neck pain and neck stiffness.    Allergies  Penicillins; Bee venom; Clindamycin/lincomycin; and Cranberry  Home Medications   Prior to Admission medications   Medication Sig Start Date End Date Taking? Authorizing Provider  ibuprofen (ADVIL,MOTRIN) 200 MG tablet Take 200 mg by mouth every 6 (six) hours as needed for moderate pain.    Yes Historical Provider, MD  neomycin-bacitracin-polymyxin (NEOSPORIN) ointment Apply 1 application topically daily as needed for wound care. apply to eye   Yes Historical Provider, MD  traMADol (ULTRAM) 50 MG tablet Take 1 tablet (50 mg total) by mouth every 6 (six) hours as needed. 09/10/13   Burgess Amor, PA-C   Triage Vitals: BP 126/59  Pulse 64  Temp(Src) 98.8 F (37.1 C) (Oral)  Resp 16  Ht 5\' 7"  (1.702 m)  Wt 194 lb (87.998 kg)  BMI 30.38 kg/m2  SpO2 100% Physical Exam  Constitutional: He is oriented to person, place, and time. He appears well-developed and well-nourished. No distress.  HENT:  Head: Normocephalic and atraumatic.  Right Ear: Tympanic membrane and external ear normal.  Left Ear: Tympanic membrane and external ear normal.  Mouth/Throat: Uvula is midline, oropharynx is clear and moist and mucous membranes are normal. He does not have dentures. No oral lesions. No trismus in the jaw. Abnormal dentition. Dental caries present. No dental abscesses.    No facial erythema or edema.  Eyes: Conjunctivae are normal.  Neck: Normal range of motion. Neck supple.  Cardiovascular: Normal rate and normal heart sounds.   Pulmonary/Chest: Effort normal.  Abdominal: He exhibits no distension.  Musculoskeletal: Normal range of motion.  Lymphadenopathy:    He has no cervical adenopathy.  Neurological: He is alert and oriented to person, place, and time.  Skin: Skin is warm and dry. No erythema.  Psychiatric: He has a normal mood and affect.    ED Course  Procedures (including critical care time) DIAGNOSTIC STUDIES: Oxygen  Saturation is 100% on RA, normal by my interpretation.   COORDINATION OF CARE: 12:11 PM- Advised pt to continue antibiotic he was started on earlier this week and will prescribe pain medication and refer to dentist. Pt verbalizes understanding and agrees to plan.  Medications  traMADol (ULTRAM) tablet 50 mg (50 mg Oral Given 09/10/13 1225)    Labs Review Labs Reviewed - No data to display  Imaging Review No results found.   EKG Interpretation None      MDM   Final diagnoses:  Pain, dental    Pt is currently on doxycycline for scalp lesion.  No obvious dental infection or abscess.  He was encouraged to finish doxy.  Prescribed tramadol for pain relief.  Referrals given for dental f/u. PRN f/u here if sx worsen.  The patient appears reasonably screened and/or stabilized for discharge and I doubt any other medical condition or other [facility] requiring further screening, evaluation, or treatment in the ED at this time prior to discharge.   I personally performed the services described in this documentation, which was scribed in my presence. The recorded information has been reviewed and is accurate.    Burgess AmorJulie Emmeline Winebarger, PA-C 09/10/13 2121

## 2013-09-10 NOTE — Discharge Instructions (Signed)
Dental Pain A tooth ache may be caused by cavities (tooth decay). Cavities expose the nerve of the tooth to air and hot or cold temperatures. It may come from an infection or abscess (also called a boil or furuncle) around your tooth. It is also often caused by dental caries (tooth decay). This causes the pain you are having. DIAGNOSIS  Your caregiver can diagnose this problem by exam. TREATMENT   If caused by an infection, it may be treated with medications which kill germs (antibiotics) and pain medications as prescribed by your caregiver. Take medications as directed.  Only take over-the-counter or prescription medicines for pain, discomfort, or fever as directed by your caregiver.  Whether the tooth ache today is caused by infection or dental disease, you should see your dentist as soon as possible for further care. SEEK MEDICAL CARE IF: The exam and treatment you received today has been provided on an emergency basis only. This is not a substitute for complete medical or dental care. If your problem worsens or new problems (symptoms) appear, and you are unable to meet with your dentist, call or return to this location. SEEK IMMEDIATE MEDICAL CARE IF:   You have a fever.  You develop redness and swelling of your face, jaw, or neck.  You are unable to open your mouth.  You have severe pain uncontrolled by pain medicine. MAKE SURE YOU:   Understand these instructions.  Will watch your condition.  Will get help right away if you are not doing well or get worse. Document Released: 01/19/2005 Document Revised: 04/13/2011 Document Reviewed: 09/07/2007 [facility] Patient Information 2015 [employer], [place]. This information is not intended to replace advice given to you by your health care provider. Make sure you discuss any questions you have with your health care provider.  Complete your entire course of antibiotics you were prescribed last week.  You  may use the ultram for pain relief but  do not drive within 4 hours of taking as this will make you drowsy.  Avoid applying heat or ice to this abscess area which can worsen your symptoms.  You may use warm salt water swish and spit treatment or half peroxide and water swish and spit after meals to keep this area clean as discussed.  Call the dentist listed above for further management of your symptoms.

## 2013-09-12 NOTE — ED Provider Notes (Signed)
Medical screening examination/treatment/procedure(s) were performed by non-physician practitioner and as supervising physician I was immediately available for consultation/collaboration.   EKG Interpretation None       Donnetta HutchingBrian Heatherly Stenner, MD 09/12/13 [phone]

## 2013-09-26 ENCOUNTER — Encounter (HOSPITAL_COMMUNITY): Payer: Self-pay | Admitting: Emergency Medicine

## 2013-09-26 ENCOUNTER — Emergency Department (HOSPITAL_COMMUNITY)
Admission: EM | Admit: 2013-09-26 | Discharge: 2013-09-26 | Disposition: A | Discharge status: Home / Self Care | Payer: Self-pay | Attending: Emergency Medicine | Admitting: Emergency Medicine

## 2013-09-26 DIAGNOSIS — W108XXA Fall (on) (from) other stairs and steps, initial encounter: Secondary | ICD-10-CM | POA: Insufficient documentation

## 2013-09-26 DIAGNOSIS — Y9389 Activity, other specified: Secondary | ICD-10-CM | POA: Insufficient documentation

## 2013-09-26 DIAGNOSIS — S20229A Contusion of unspecified back wall of thorax, initial encounter: Secondary | ICD-10-CM | POA: Insufficient documentation

## 2013-09-26 DIAGNOSIS — Z8719 Personal history of other diseases of the digestive system: Secondary | ICD-10-CM | POA: Insufficient documentation

## 2013-09-26 DIAGNOSIS — Y9289 Other specified places as the place of occurrence of the external cause: Secondary | ICD-10-CM | POA: Insufficient documentation

## 2013-09-26 DIAGNOSIS — F17200 Nicotine dependence, unspecified, uncomplicated: Secondary | ICD-10-CM | POA: Insufficient documentation

## 2013-09-26 DIAGNOSIS — IMO0002 Reserved for concepts with insufficient information to code with codable children: Secondary | ICD-10-CM | POA: Insufficient documentation

## 2013-09-26 DIAGNOSIS — Z8679 Personal history of other diseases of the circulatory system: Secondary | ICD-10-CM | POA: Insufficient documentation

## 2013-09-26 DIAGNOSIS — Z880 Allergy status to penicillin: Secondary | ICD-10-CM | POA: Insufficient documentation

## 2013-09-26 DIAGNOSIS — S20221A Contusion of right back wall of thorax, initial encounter: Secondary | ICD-10-CM

## 2013-09-26 DIAGNOSIS — Z8781 Personal history of (healed) traumatic fracture: Secondary | ICD-10-CM | POA: Insufficient documentation

## 2013-09-26 MED ORDER — DIAZEPAM 5 MG PO TABS
2.5000 mg | ORAL_TABLET | Freq: Once | ORAL | Status: AC
  Administered 2013-09-26: 2.5 mg via ORAL
  Filled 2013-09-26: qty 1

## 2013-09-26 MED ORDER — OXYCODONE-ACETAMINOPHEN 5-325 MG PO TABS
2.0000 | ORAL_TABLET | Freq: Once | ORAL | Status: AC
  Administered 2013-09-26: 2 via ORAL
  Filled 2013-09-26: qty 2

## 2013-09-26 MED ORDER — IBUPROFEN 400 MG PO TABS
600.0000 mg | ORAL_TABLET | Freq: Once | ORAL | Status: AC
  Administered 2013-09-26: 600 mg via ORAL
  Filled 2013-09-26: qty 2

## 2013-09-26 NOTE — Discharge Instructions (Signed)
Contusion °A contusion is a deep bruise. Contusions are the result of an injury that caused bleeding under the skin. The contusion may turn blue, purple, or yellow. Minor injuries will give you a painless contusion, but more severe contusions may stay painful and swollen for a few weeks.  °CAUSES  °A contusion is usually caused by a blow, trauma, or direct force to an area of the body. °SYMPTOMS  °· Swelling and redness of the injured area. °· Bruising of the injured area. °· Tenderness and soreness of the injured area. °· Pain. °DIAGNOSIS  °The diagnosis can be made by taking a history and physical exam. An X-ray, CT scan, or MRI may be needed to determine if there were any associated injuries, such as fractures. °TREATMENT  °Specific treatment will depend on what area of the body was injured. In general, the best treatment for a contusion is resting, icing, elevating, and applying cold compresses to the injured area. Over-the-counter medicines may also be recommended for pain control. Ask your caregiver what the best treatment is for your contusion. °HOME CARE INSTRUCTIONS  °· Put ice on the injured area. °¨ Put ice in a plastic bag. °¨ Place a towel between your skin and the bag. °¨ Leave the ice on for 15-20 minutes, 3-4 times a day, or as directed by your health care provider. °· Only take over-the-counter or prescription medicines for pain, discomfort, or fever as directed by your caregiver. Your caregiver may recommend avoiding anti-inflammatory medicines (aspirin, ibuprofen, and naproxen) for 48 hours because these medicines may increase bruising. °· Rest the injured area. °· If possible, elevate the injured area to reduce swelling. °SEEK IMMEDIATE MEDICAL CARE IF:  °· You have increased bruising or swelling. °· You have pain that is getting worse. °· Your swelling or pain is not relieved with medicines. °MAKE SURE YOU:  °· Understand these instructions. °· Will watch your condition. °· Will get help right  away if you are not doing well or get worse. °Document Released: 10/29/2004 Document Revised: 01/24/2013 Document Reviewed: 11/24/2010 °ExitCare® Patient Information ©2015 [employer]. This information is not intended to replace advice given to you by your health care provider. Make sure you discuss any questions you have with your health care provider. ° °

## 2013-09-26 NOTE — ED Notes (Signed)
Pt reports around 0530 today he slipped down 4 wooden stairs and hurt his lower back, and has tingling in right hand.  Pt is able to move all extremities, no LOC, no head injury. Pt reports that he can walk but certain movements make it hurt worse.

## 2013-10-01 NOTE — ED Provider Notes (Signed)
CSN: [id number]     Arrival date & time 09/26/13  0758 History   First MD Initiated Contact with Patient 09/26/13 0815     Chief Complaint  Patient presents with  . Back Injury     (Consider location/radiation/quality/duration/timing/severity/associated sxs/prior Treatment) HPI  33 year old male with lower back pain. Onset around 5:30 this morning. She slipped and fell down approximately 4 steps. Persistent back pain since. Has been ambulatory since this fall, but with increased pain. No numbness or tingling in the lower extremities. No bladder or bowel incontinence or retention. No intervention prior to arrival.  Past Medical History  Diagnosis Date  . Hernia   . Arm fracture, left   . Migraine    Past Surgical History  Procedure Laterality Date  . Hernia repair    . Hernia repair    . Left arm fracture     Family History  Problem Relation Age of Onset  . Hypertension Father   . Diabetes Father   . Hypertension Sister   . Diabetes Sister   . Hypertension Mother   . Hypertension Brother    History  Substance Use Topics  . Smoking status: Current Every Day Smoker -- 0.50 packs/day for 15 years    Types: Cigarettes  . Smokeless tobacco: Never Used  . Alcohol Use: Yes    Review of Systems    Allergies  Penicillins; Bee venom; Clindamycin/lincomycin; and Cranberry  Home Medications   Prior to Admission medications   Medication Sig Start Date End Date Taking? Authorizing Provider  ibuprofen (ADVIL,MOTRIN) 200 MG tablet Take 200 mg by mouth every 6 (six) hours as needed for moderate pain.     Historical Provider, MD  neomycin-bacitracin-polymyxin (NEOSPORIN) ointment Apply 1 application topically daily as needed for wound care. apply to eye    Historical Provider, MD  traMADol (ULTRAM) 50 MG tablet Take 1 tablet (50 mg total) by mouth every 6 (six) hours as needed. 09/10/13   Burgess Amor, PA-C   BP 135/74  Pulse 57  Temp(Src) 98.1 F (36.7 C) (Oral)  Resp 14  Ht   (1.702 m)  Wt 194 lb (87.998 kg)  BMI 30.38 kg/m2  SpO2 100% Physical Exam  Nursing note and vitals reviewed. Constitutional: He appears well-developed and well-nourished. No distress.  HENT:  Head: Normocephalic and atraumatic.  Eyes: Conjunctivae are normal. Right eye exhibits no discharge. Left eye exhibits no discharge.  Neck: Neck supple.  Cardiovascular: Normal rate, regular rhythm and normal heart sounds.  Exam reveals no gallop and no friction rub.   No murmur heard. Pulmonary/Chest: Effort normal and breath sounds normal. No respiratory distress.  Abdominal: Soft. He exhibits no distension. There is no tenderness.  Musculoskeletal: He exhibits no edema and no tenderness.       Arms: Mild tenderness in the depicted area. No crepitus or deformity. Strength is 5 out of 5 bilateral lower extremities. Sensation is intact to light touch. Patellar reflexes are 2+ bilaterally. Palpable DP pulse. Gait is steady.  Neurological: He is alert.  Skin: Skin is warm and dry.  Psychiatric: He has a normal mood and affect. His behavior is normal. Thought content normal.    ED Course  Procedures (including critical care time) Labs Review Labs Reviewed - No data to display  Imaging Review No results found.   EKG Interpretation None      MDM   Final diagnoses:  Back contusion, right, initial encounter    33 year old male with back pain after  a fall. Exam consistent with contusion. Neurovascularly intact. Very low suspicion for fracture. No evidence of neurological injury. Meds as ordered in the emergency room. NSAIDs for further pain/soreness. Return precautions were discussed.    Raeford Razor, MD 10/01/13 2148

## 2014-02-12 ENCOUNTER — Encounter (HOSPITAL_COMMUNITY): Payer: Self-pay | Admitting: Emergency Medicine

## 2014-02-12 ENCOUNTER — Emergency Department (HOSPITAL_COMMUNITY)
Admission: EM | Admit: 2014-02-12 | Discharge: 2014-02-12 | Discharge status: Against Medical Advice | Payer: Self-pay | Attending: Emergency Medicine | Admitting: Emergency Medicine

## 2014-02-12 DIAGNOSIS — Z720 Tobacco use: Secondary | ICD-10-CM | POA: Insufficient documentation

## 2014-02-12 DIAGNOSIS — L729 Follicular cyst of the skin and subcutaneous tissue, unspecified: Secondary | ICD-10-CM | POA: Insufficient documentation

## 2014-02-12 NOTE — ED Notes (Signed)
Cyst on back of head since 2011, this past week started getting bigger and painful

## 2014-03-26 ENCOUNTER — Emergency Department (HOSPITAL_COMMUNITY)
Admission: EM | Admit: 2014-03-26 | Discharge: 2014-03-26 | Disposition: A | Discharge status: Home / Self Care | Payer: Self-pay | Attending: Emergency Medicine | Admitting: Emergency Medicine

## 2014-03-26 ENCOUNTER — Encounter (HOSPITAL_COMMUNITY): Payer: Self-pay | Admitting: Family Medicine

## 2014-03-26 ENCOUNTER — Emergency Department (HOSPITAL_COMMUNITY): Payer: Self-pay

## 2014-03-26 DIAGNOSIS — K5900 Constipation, unspecified: Secondary | ICD-10-CM | POA: Insufficient documentation

## 2014-03-26 DIAGNOSIS — Z8679 Personal history of other diseases of the circulatory system: Secondary | ICD-10-CM | POA: Insufficient documentation

## 2014-03-26 DIAGNOSIS — R1033 Periumbilical pain: Secondary | ICD-10-CM | POA: Insufficient documentation

## 2014-03-26 DIAGNOSIS — Z8781 Personal history of (healed) traumatic fracture: Secondary | ICD-10-CM | POA: Insufficient documentation

## 2014-03-26 DIAGNOSIS — Z880 Allergy status to penicillin: Secondary | ICD-10-CM | POA: Insufficient documentation

## 2014-03-26 DIAGNOSIS — Z720 Tobacco use: Secondary | ICD-10-CM | POA: Insufficient documentation

## 2014-03-26 LAB — COMPREHENSIVE METABOLIC PANEL
ALT: 18 U/L (ref 0–53)
AST: 16 U/L (ref 0–37)
Albumin: 4 g/dL (ref 3.5–5.2)
Alkaline Phosphatase: 40 U/L (ref 39–117)
Anion gap: 5 (ref 5–15)
BUN: 9 mg/dL (ref 6–23)
CO2: 27 mmol/L (ref 19–32)
Calcium: 9.2 mg/dL (ref 8.4–10.5)
Chloride: 109 mmol/L (ref 96–112)
Creatinine, Ser: 0.73 mg/dL (ref 0.50–1.35)
GFR calc Af Amer: >90 mL/min (ref >90)
GFR calc non Af Amer: >90 mL/min (ref >90)
Glucose, Bld: 88 mg/dL (ref 70–99)
Potassium: 3.8 mmol/L (ref 3.5–5.1)
Sodium: 141 mmol/L (ref 135–145)
Total Bilirubin: 0.7 mg/dL (ref 0.3–1.2)
Total Protein: 6.9 g/dL (ref 6.0–8.3)

## 2014-03-26 LAB — CBC WITH DIFFERENTIAL/PLATELET
Basophils Absolute: 0 10*3/uL (ref 0.0–0.1)
Basophils Relative: 1 % (ref 0–1)
Eosinophils Absolute: 0.1 10*3/uL (ref 0.0–0.7)
Eosinophils Relative: 2 % (ref 0–5)
HCT: 42.2 % (ref 39.0–52.0)
Hemoglobin: 14.1 g/dL (ref 13.0–17.0)
Lymphocytes Relative: 52 % — ABNORMAL HIGH (ref 12–46)
Lymphs Abs: 2.8 10*3/uL (ref 0.7–4.0)
MCH: 29.9 pg (ref 26.0–34.0)
MCHC: 33.4 g/dL (ref 30.0–36.0)
MCV: 89.6 fL (ref 78.0–100.0)
Monocytes Absolute: 0.3 10*3/uL (ref 0.1–1.0)
Monocytes Relative: 6 % (ref 3–12)
Neutro Abs: 2.1 10*3/uL (ref 1.7–7.7)
Neutrophils Relative %: 39 % — ABNORMAL LOW (ref 43–77)
Platelets: 256 10*3/uL (ref 150–400)
RBC: 4.71 MIL/uL (ref 4.22–5.81)
RDW: 13.4 % (ref 11.5–15.5)
WBC: 5.3 10*3/uL (ref 4.0–10.5)

## 2014-03-26 LAB — URINALYSIS, ROUTINE W REFLEX MICROSCOPIC
Bilirubin Urine: NEGATIVE
Glucose, UA: NEGATIVE mg/dL
Hgb urine dipstick: NEGATIVE
Ketones, ur: NEGATIVE mg/dL
Leukocytes, UA: NEGATIVE
Nitrite: NEGATIVE
Protein, ur: NEGATIVE mg/dL
Specific Gravity, Urine: 1.02 (ref 1.005–1.030)
Urobilinogen, UA: 1 mg/dL (ref 0.0–1.0)
pH: 6 (ref 5.0–8.0)

## 2014-03-26 LAB — LIPASE, BLOOD: Lipase: 40 U/L (ref 11–59)

## 2014-03-26 MED ORDER — DOCUSATE SODIUM 100 MG PO CAPS: 100.0000 mg | ORAL_CAPSULE | Freq: Two times a day (BID) | ORAL | Status: DC

## 2014-03-26 MED ORDER — IOHEXOL 300 MG/ML  SOLN
25.0000 mL | Freq: Once | INTRAMUSCULAR | Status: AC | PRN
  Administered 2014-03-26: 25 mL via ORAL

## 2014-03-26 MED ORDER — PROMETHAZINE HCL 25 MG PO TABS: 25.0000 mg | ORAL_TABLET | Freq: Three times a day (TID) | ORAL | Status: DC | PRN

## 2014-03-26 MED ORDER — IOHEXOL 300 MG/ML  SOLN
100.0000 mL | Freq: Once | INTRAMUSCULAR | Status: AC | PRN
Start: 2014-03-26 — End: 2014-03-26
  Administered 2014-03-26: 80 mL via INTRAVENOUS

## 2014-03-26 MED ORDER — TRAMADOL HCL 50 MG PO TABS: 50.0000 mg | ORAL_TABLET | Freq: Four times a day (QID) | ORAL | Status: DC | PRN

## 2014-03-26 MED ORDER — SODIUM CHLORIDE 0.9 % IV BOLUS (SEPSIS)
1000.0000 mL | Freq: Once | INTRAVENOUS | Status: AC
  Administered 2014-03-26: 1000 mL via INTRAVENOUS

## 2014-03-26 NOTE — Discharge Instructions (Signed)
Return here as needed.  Follow-up with primary care doctor.  Your CT scan and lab tests today were normal

## 2014-03-26 NOTE — ED Provider Notes (Signed)
CSN: [id number]     Arrival date & time 03/26/14  1013 History   First MD Initiated Contact with Patient 03/26/14 1107     Chief Complaint  Patient presents with  . Abdominal Pain     (Consider location/radiation/quality/duration/timing/severity/associated sxs/prior Treatment) HPI Patient presents to the emergency department with abdominal pain that started 1 week ago.  Patient states he has mid to lower abdominal pain.  He does not radiate any where other than in the midline of his abdomen.  He denies chest pain, shortness of breath, nausea, vomiting, diarrhea, weakness, headache, blurred vision, back pain, dysuria, incontinence, bloody stool, hematemesis, cough, runny nose, sore throat, fever, near syncope or syncope.  The patient states that he did not take any medications prior to arrival.  The patient states that he has had some constipation over the last week Past Medical History  Diagnosis Date  . Hernia   . Arm fracture, left   . Migraine    Past Surgical History  Procedure Laterality Date  . Hernia repair    . Hernia repair    . Left arm fracture     Family History  Problem Relation Age of Onset  . Hypertension Father   . Diabetes Father   . Hypertension Sister   . Diabetes Sister   . Hypertension Mother   . Hypertension Brother    History  Substance Use Topics  . Smoking status: Current Every Day Smoker -- 0.50 packs/day for 15 years    Types: Cigarettes  . Smokeless tobacco: Never Used  . Alcohol Use: Yes     Comment: occ    Review of Systems  All other systems negative except as documented in the HPI. All pertinent positives and negatives as reviewed in the HPI.  Allergies  Penicillins; Bee venom; Clindamycin/lincomycin; and Cranberry  Home Medications   Prior to Admission medications   Medication Sig Start Date End Date Taking? Authorizing Provider  ibuprofen (ADVIL,MOTRIN) 200 MG tablet Take 200 mg by mouth every 6 (six) hours as needed for moderate  pain.    Yes Historical Provider, MD  traMADol (ULTRAM) 50 MG tablet Take 1 tablet (50 mg total) by mouth every 6 (six) hours as needed. Patient not taking: Reported on 03/26/2014 09/10/13   Burgess Amor, PA-C   BP 117/92 mmHg  Pulse 58  Temp(Src) 97.9 F (36.6 C)  Resp 18  SpO2 98% Physical Exam  Constitutional: He is oriented to person, place, and time. He appears well-developed and well-nourished. No distress.  HENT:  Head: Normocephalic and atraumatic.  Mouth/Throat: Oropharynx is clear and moist.  Eyes: Pupils are equal, round, and reactive to light.  Neck: Normal range of motion. Neck supple.  Cardiovascular: Normal rate, regular rhythm and normal heart sounds.  Exam reveals no gallop and no friction rub.   No murmur heard. Pulmonary/Chest: Effort normal and breath sounds normal. No respiratory distress.  Abdominal: Soft. Normal appearance and bowel sounds are normal. He exhibits no distension. There is tenderness. There is no rebound and no guarding.    Neurological: He is alert and oriented to person, place, and time. He exhibits normal muscle tone. Coordination normal.  Skin: Skin is warm and dry. No rash noted. No erythema.  Nursing note and vitals reviewed.   ED Course  Procedures (including critical care time) Labs Review Labs Reviewed  CBC WITH DIFFERENTIAL/PLATELET - Abnormal; Notable for the following:    Neutrophils Relative % 39 (*)    Lymphocytes Relative 52 (*)  All other components within normal limits  COMPREHENSIVE METABOLIC PANEL  LIPASE, BLOOD  URINALYSIS, ROUTINE W REFLEX MICROSCOPIC    Imaging Review Ct Abdomen Pelvis W Contrast  03/26/2014   CLINICAL DATA:  Two week history well nausea and periumbilical pain. Constipation for 3 days  EXAM: CT ABDOMEN AND PELVIS WITH CONTRAST  TECHNIQUE: Multidetector CT imaging of the abdomen and pelvis was performed using the standard protocol following bolus administration of intravenous contrast. Oral contrast was  also administered.  CONTRAST:  80mL OMNIPAQUE IOHEXOL 300 MG/ML  SOLN  COMPARISON:  None.  FINDINGS: There is patchy atelectatic change in the posterior lung bases bilaterally, slightly more on the right than on the left.  Liver is prominent measuring 20.4 cm in length. No focal liver lesions are identified on this noncontrast enhanced study. Gallbladder wall is not appreciably thickened. There is no biliary duct dilatation.  Spleen, pancreas, and adrenals appear normal. Kidneys bilaterally show no mass or hydronephrosis on either side. There is no renal or ureteral calculus on either side. There is a small phlebolith immediately adjacent to the felt to be separate from the distal right ureter.  In the pelvis, the urinary bladder is midline with normal wall thickness. There is no pelvic mass or pelvic fluid collection. Appendix is not seen. There is no periappendiceal region inflammation, however.  There is no bowel obstruction. No free air or portal venous air. There is moderate stool throughout colon diffusely.  There is no appreciable ascites, adenopathy, or abscess in the abdomen or pelvis. There is no abdominal aortic aneurysm. There are no blastic or lytic bone lesions.  IMPRESSION: Liver prominent without focal lesions.  No bowel obstruction. No abscess. No periappendiceal region inflammation.  No renal or ureteral calculus.  No hydronephrosis.   Electronically Signed   By: Bretta BangWilliam  Woodruff III M.D.   On: 03/26/2014 14:13    Patient be treated for nonspecific abdominal pain.  Told to return here as needed.  The patient is advised to increase his fluid intake, rest as much as possible  MDM   Final diagnoses:  None        Carlyle DollyChristopher W Dajane Valli, PA-C 03/26/14 1432  Richardean Canalavid H Yao, MD 03/26/14 [phone]

## 2014-03-26 NOTE — ED Notes (Signed)
Pt sts mid to lower abd pain x 1 week., sts last normal BM was 2 weeks ago. Denies N,V,D.

## 2014-09-16 ENCOUNTER — Encounter (HOSPITAL_COMMUNITY): Payer: Self-pay | Admitting: *Deleted

## 2014-09-16 ENCOUNTER — Emergency Department (HOSPITAL_COMMUNITY)
Admission: EM | Admit: 2014-09-16 | Discharge: 2014-09-16 | Disposition: A | Discharge status: Home / Self Care | Payer: Self-pay | Attending: Emergency Medicine | Admitting: Emergency Medicine

## 2014-09-16 DIAGNOSIS — Z8679 Personal history of other diseases of the circulatory system: Secondary | ICD-10-CM | POA: Insufficient documentation

## 2014-09-16 DIAGNOSIS — L539 Erythematous condition, unspecified: Secondary | ICD-10-CM | POA: Insufficient documentation

## 2014-09-16 DIAGNOSIS — Z202 Contact with and (suspected) exposure to infections with a predominantly sexual mode of transmission: Secondary | ICD-10-CM | POA: Insufficient documentation

## 2014-09-16 DIAGNOSIS — Z8781 Personal history of (healed) traumatic fracture: Secondary | ICD-10-CM | POA: Insufficient documentation

## 2014-09-16 DIAGNOSIS — Z880 Allergy status to penicillin: Secondary | ICD-10-CM | POA: Insufficient documentation

## 2014-09-16 DIAGNOSIS — Z720 Tobacco use: Secondary | ICD-10-CM | POA: Insufficient documentation

## 2014-09-16 DIAGNOSIS — Z8719 Personal history of other diseases of the digestive system: Secondary | ICD-10-CM | POA: Insufficient documentation

## 2014-09-16 MED ORDER — METRONIDAZOLE 500 MG PO TABS
500.0000 mg | ORAL_TABLET | Freq: Once | ORAL | Status: AC
  Administered 2014-09-16: 500 mg via ORAL
  Filled 2014-09-16: qty 1

## 2014-09-16 MED ORDER — METRONIDAZOLE 500 MG PO TABS: 500.0000 mg | ORAL_TABLET | Freq: Two times a day (BID) | ORAL | Status: DC

## 2014-09-16 NOTE — ED Notes (Deleted)
Pt. C/o left knee pain, denies injury, reports lifting boxes Friday night.

## 2014-09-16 NOTE — Discharge Instructions (Signed)
Safe Sex Safe sex is about reducing the risk of giving or getting a sexually transmitted disease (STD). STDs are spread through sexual contact involving the genitals, mouth, or rectum. Some STDs can be cured and others cannot. Safe sex can also prevent unintended pregnancies.  WHAT ARE SOME SAFE SEX PRACTICES?  Limit your sexual activity to only one partner who is having sex with only you.  Talk to your partner about his or her past partners, past STDs, and drug use.  Use a condom every time you have sexual intercourse. This includes vaginal, oral, and anal sexual activity. Both females and males should wear condoms during oral sex. Only use latex or polyurethane condoms and water-based lubricants. Using petroleum-based lubricants or oils to lubricate a condom will weaken the condom and increase the chance that it will break. The condom should be in place from the beginning to the end of sexual activity. Wearing a condom reduces, but does not completely eliminate, your risk of getting or giving an STD. STDs can be spread by contact with infected body fluids and skin.  Get vaccinated for hepatitis B and HPV.  Avoid alcohol and recreational drugs, which can affect your judgment. You may forget to use a condom or participate in high-risk sex.  For females, avoid douching after sexual intercourse. Douching can spread an infection farther into the reproductive tract.  Check your body for signs of sores, blisters, rashes, or unusual discharge. See your health care provider if you notice any of these signs.  Avoid sexual contact if you have symptoms of an infection or are being treated for an STD. If you or your partner has herpes, avoid sexual contact when blisters are present. Use condoms at all other times.  If you are at risk of being infected with HIV, it is recommended that you take a prescription medicine daily to prevent HIV infection. This is called pre-exposure prophylaxis (PrEP). You are  considered at risk if:  You are a man who has sex with other men (MSM).  You are a heterosexual man or woman who is sexually active with more than one partner.  You take drugs by injection.  You are sexually active with a partner who has HIV.  Talk with your health care provider about whether you are at high risk of being infected with HIV. If you choose to begin PrEP, you should first be tested for HIV. You should then be tested every 3 months for as long as you are taking PrEP.  See your health care provider for regular screenings, exams, and tests for other STDs. Before having sex with a new partner, each of you should be screened for STDs and should talk about the results with each other. WHAT ARE THE BENEFITS OF SAFE SEX?   There is less chance of getting or giving an STD.  You can prevent unwanted or unintended pregnancies.  By discussing safe sex concerns with your partner, you may increase feelings of intimacy, comfort, trust, and honesty between the two of you. Document Released: 02/27/2004 Document Revised: 06/05/2013 Document Reviewed: 07/13/2011 [facility] Patient Information 2015 [place], [place]. This information is not intended to replace advice given to you by your health care provider. Make sure you discuss any questions you have with your health care provider.  Trichomoniasis Trichomoniasis is an infection caused by an organism called Trichomonas. The infection can affect both women and men. In women, the outer male genitalia and the vagina are affected. In men, the penis is  mainly affected, but the prostate and other reproductive organs can also be involved. Trichomoniasis is a sexually transmitted infection (STI) and is most often passed to another person through sexual contact.  RISK FACTORS  Having unprotected sexual intercourse.  Having sexual intercourse with an infected partner. SIGNS AND SYMPTOMS  Symptoms of trichomoniasis in women include:  Abnormal  gray-green frothy vaginal discharge.  Itching and irritation of the vagina.  Itching and irritation of the area outside the vagina. Symptoms of trichomoniasis in men include:   Penile discharge with or without pain.  Pain during urination. This results from inflammation of the urethra. DIAGNOSIS  Trichomoniasis may be found during a Pap test or physical exam. Your health care provider may use one of the following methods to help diagnose this infection:  Examining vaginal discharge under a microscope. For men, urethral discharge would be examined.  Testing the pH of the vagina with a test tape.  Using a vaginal swab test that checks for the Trichomonas organism. A test is available that provides results within a few minutes.  Doing a culture test for the organism. This is not usually needed. TREATMENT   You may be given medicine to fight the infection. Women should inform their health care provider if they could be or are pregnant. Some medicines used to treat the infection should not be taken during pregnancy.  Your health care provider may recommend over-the-counter medicines or creams to decrease itching or irritation.  Your sexual partner will need to be treated if infected. HOME CARE INSTRUCTIONS   Take medicines only as directed by your health care provider.  Take over-the-counter medicine for itching or irritation as directed by your health care provider.  Do not have sexual intercourse while you have the infection.  Women should not douche or wear tampons while they have the infection.  Discuss your infection with your partner. Your partner may have gotten the infection from you, or you may have gotten it from your partner.  Have your sex partner get examined and treated if necessary.  Practice safe, informed, and protected sex.  See your health care provider for other STI testing. SEEK MEDICAL CARE IF:   You still have symptoms after you finish your  medicine.  You develop abdominal pain.  You have pain when you urinate.  You have bleeding after sexual intercourse.  You develop a rash.  Your medicine makes you sick or makes you throw up (vomit). MAKE SURE YOU:  Understand these instructions.  Will watch your condition.  Will get help right away if you are not doing well or get worse. Document Released: 07/15/2000 Document Revised: 06/05/2013 Document Reviewed: 10/31/2012 [facility] Patient Information 2015 [place]. This information is not intended to replace advice given to you by your health care provider. Make sure you discuss any questions you have with your health care provider.

## 2014-09-16 NOTE — ED Provider Notes (Signed)
CSN: [id number]     Arrival date & time 09/16/14  1328 History  This chart was scribed for non-physician practitioner, Burgess Amor, PA-C, working with Bethann Berkshire, MD, by Ronney Lion, ED Scribe. This patient was seen in room APFT24/APFT24 and the patient's care was started at 2:59 PM.    Chief Complaint  Patient presents with  . SEXUALLY TRANSMITTED DISEASE   The history is provided by the patient. No language interpreter was used.    HPI Comments: Miguel Robbins is a 34 y.o. male who presents to the Emergency Department complaining of exposure to STD's - patient's sexual partner is being evaluated here today, and was diagnosed with trichomonas. Patient states he has been having no symptoms but just wanted to be checked out. He states he has been using condoms. Patient denies a history of any chronic medical conditions. He notes allergies to penicillin. He denies abdominal pain, dysuria, or fever.   Patient notes he vomited for one day 3 days ago, but that resolved after that one day. He suspects his symptoms were due to a stomach bug.    Patient mentions he was stung by a yellow bumblebee yesterday on his mid back, which became swollen, red, and painful with walking. He would like this checked out as well.  Past Medical History  Diagnosis Date  . Hernia   . Arm fracture, left   . Migraine    Past Surgical History  Procedure Laterality Date  . Hernia repair    . Hernia repair    . Left arm fracture     Family History  Problem Relation Age of Onset  . Hypertension Father   . Diabetes Father   . Hypertension Sister   . Diabetes Sister   . Hypertension Mother   . Hypertension Brother    Social History  Substance Use Topics  . Smoking status: Current Every Day Smoker -- 0.50 packs/day for 15 years    Types: Cigarettes  . Smokeless tobacco: Never Used  . Alcohol Use: Yes     Comment: occ    Review of Systems  Constitutional: Negative for fever.  HENT: Negative for  congestion and sore throat.   Eyes: Negative.   Respiratory: Negative for chest tightness and shortness of breath.   Cardiovascular: Negative for chest pain.  Gastrointestinal: Negative for nausea and abdominal pain.  Genitourinary: Negative.  Negative for dysuria, discharge and penile pain.  Musculoskeletal: Negative for joint swelling, arthralgias and neck pain.  Skin: Positive for wound. Negative for rash.  Neurological: Negative for dizziness, weakness, light-headedness, numbness and headaches.  Psychiatric/Behavioral: Negative.       Allergies  Penicillins; Bee venom; Clindamycin/lincomycin; and Cranberry  Home Medications   Prior to Admission medications   Medication Sig Start Date End Date Taking? Authorizing Provider  docusate sodium (COLACE) 100 MG capsule Take 1 capsule (100 mg total) by mouth every 12 (twelve) hours. 03/26/14   Charlestine Night, PA-C  ibuprofen (ADVIL,MOTRIN) 200 MG tablet Take 200 mg by mouth every 6 (six) hours as needed for moderate pain.     Historical Provider, MD  metroNIDAZOLE (FLAGYL) 500 MG tablet Take 1 tablet (500 mg total) by mouth 2 (two) times daily. 09/16/14   Burgess Amor, PA-C  promethazine (PHENERGAN) 25 MG tablet Take 1 tablet (25 mg total) by mouth every 8 (eight) hours as needed for nausea or vomiting. 03/26/14   Charlestine Night, PA-C  traMADol (ULTRAM) 50 MG tablet Take 1 tablet (50 mg total) by  mouth every 6 (six) hours as needed. 03/26/14   Christopher Lawyer, PA-C   BP 136/88 mmHg  Pulse 60  Temp(Src) 98 F (36.7 C) (Oral)  Resp 18  Ht 5' 7.75" (1.721 m)  Wt 190 lb (86.183 kg)  BMI 29.10 kg/m2  SpO2 100% Physical Exam  Constitutional: He appears well-developed and well-nourished.  HENT:  Head: Normocephalic and atraumatic.  Eyes: Conjunctivae are normal.  Neck: Normal range of motion.  Cardiovascular: Normal rate, regular rhythm, normal heart sounds and intact distal pulses.   Pulmonary/Chest: Effort normal and breath  sounds normal. He has no wheezes.  Abdominal: Soft. Bowel sounds are normal. There is no tenderness.  Genitourinary: Penis normal. Right testis shows no tenderness. Left testis shows no tenderness. No penile erythema. No discharge found.  Musculoskeletal: Normal range of motion.  Neurological: He is alert.  Skin: Skin is warm and dry.  Small area of localized erythema mid back.  No retained foreign body.    Psychiatric: He has a normal mood and affect.  Nursing note and vitals reviewed.   ED Course  Procedures (including critical care time)  DIAGNOSTIC STUDIES: Oxygen Saturation is 99% on RA, normal by my interpretation.    COORDINATION OF CARE: 3:03 PM - Discussed treatment plan with pt at bedside which includes possible treatment for STD. Also localized reaction to bee sting; advised ice and Motrin prn. Pt verbalized understanding and agreed to plan.   3:14 PM-Discussed treatment plan which includes STD screens. Pt verbalized understanding and agreed to plan.  Labs Review Labs Reviewed  RPR  HIV ANTIBODY (ROUTINE TESTING)  GC/CHLAMYDIA PROBE AMP ([place]) NOT AT [facility]    Imaging Review No results found.   EKG Interpretation None      MDM   Final diagnoses:  Exposure to trichomonas   Std testing performed, pt aware cultures pending.  He was treated for the known exposure to trichomonas today, flagyl.  Advised benadryl, ice packs to local area of bee sting as needed.  No sign of infection at this site. No sx suggesting reaction other than localized sx.   I personally performed the services described in this documentation, which was scribed in my presence. The recorded information has been reviewed and is accurate.    Burgess Amor, PA-C 09/18/14 1339  Bethann Berkshire, MD 09/20/14 1020

## 2014-09-16 NOTE — ED Notes (Signed)
Patient states that girlfriend was dx with an STD and he want to get checked out. Denies any symptoms.

## 2014-09-17 LAB — GC/CHLAMYDIA PROBE AMP (~~LOC~~) NOT AT ARMC
CHLAMYDIA, DNA PROBE: NEGATIVE
Neisseria Gonorrhea: NEGATIVE

## 2014-09-17 LAB — HIV ANTIBODY (ROUTINE TESTING W REFLEX): HIV Screen 4th Generation wRfx: NONREACTIVE

## 2014-09-17 LAB — RPR: RPR: NONREACTIVE

## 2015-03-25 ENCOUNTER — Emergency Department (HOSPITAL_COMMUNITY)
Admission: EM | Admit: 2015-03-25 | Discharge: 2015-03-25 | Disposition: A | Discharge status: Home / Self Care | Payer: PRIVATE HEALTH INSURANCE | Attending: Emergency Medicine | Admitting: Emergency Medicine

## 2015-03-25 ENCOUNTER — Encounter (HOSPITAL_COMMUNITY): Payer: Self-pay | Admitting: *Deleted

## 2015-03-25 ENCOUNTER — Emergency Department (HOSPITAL_COMMUNITY): Payer: PRIVATE HEALTH INSURANCE

## 2015-03-25 DIAGNOSIS — F17210 Nicotine dependence, cigarettes, uncomplicated: Secondary | ICD-10-CM | POA: Insufficient documentation

## 2015-03-25 DIAGNOSIS — Z8679 Personal history of other diseases of the circulatory system: Secondary | ICD-10-CM | POA: Insufficient documentation

## 2015-03-25 DIAGNOSIS — R509 Fever, unspecified: Secondary | ICD-10-CM | POA: Insufficient documentation

## 2015-03-25 DIAGNOSIS — R6889 Other general symptoms and signs: Secondary | ICD-10-CM

## 2015-03-25 DIAGNOSIS — Z8719 Personal history of other diseases of the digestive system: Secondary | ICD-10-CM | POA: Insufficient documentation

## 2015-03-25 DIAGNOSIS — M791 Myalgia: Secondary | ICD-10-CM | POA: Insufficient documentation

## 2015-03-25 DIAGNOSIS — J029 Acute pharyngitis, unspecified: Secondary | ICD-10-CM | POA: Insufficient documentation

## 2015-03-25 DIAGNOSIS — R05 Cough: Secondary | ICD-10-CM | POA: Insufficient documentation

## 2015-03-25 DIAGNOSIS — Z880 Allergy status to penicillin: Secondary | ICD-10-CM | POA: Insufficient documentation

## 2015-03-25 DIAGNOSIS — R110 Nausea: Secondary | ICD-10-CM | POA: Insufficient documentation

## 2015-03-25 DIAGNOSIS — Z792 Long term (current) use of antibiotics: Secondary | ICD-10-CM | POA: Insufficient documentation

## 2015-03-25 DIAGNOSIS — R0602 Shortness of breath: Secondary | ICD-10-CM | POA: Insufficient documentation

## 2015-03-25 DIAGNOSIS — Z8781 Personal history of (healed) traumatic fracture: Secondary | ICD-10-CM | POA: Insufficient documentation

## 2015-03-25 LAB — RAPID STREP SCREEN (MED CTR MEBANE ONLY): Streptococcus, Group A Screen (Direct): NEGATIVE

## 2015-03-25 MED ORDER — IBUPROFEN 800 MG PO TABS
800.0000 mg | ORAL_TABLET | Freq: Once | ORAL | Status: AC
  Administered 2015-03-25: 800 mg via ORAL
  Filled 2015-03-25: qty 1

## 2015-03-25 MED ORDER — BENZONATATE 100 MG PO CAPS: 200.0000 mg | ORAL_CAPSULE | Freq: Three times a day (TID) | ORAL | Status: DC | PRN

## 2015-03-25 MED ORDER — BENZONATATE 100 MG PO CAPS: 200.0000 mg | ORAL_CAPSULE | Freq: Once | ORAL | Status: DC

## 2015-03-25 NOTE — ED Provider Notes (Signed)
CSN: [id number]     Arrival date & time 03/25/15  0704 History   First MD Initiated Contact with Patient 03/25/15 [phone]     Chief Complaint  Patient presents with  . Generalized Body Aches     (Consider location/radiation/quality/duration/timing/severity/associated sxs/prior Treatment) The history is provided by the patient.   Miguel Robbins is a 35 y.o. male presenting with a 24 hour history of generalized body aches, sore throat, subjective fever, nausea without emesis and cough productive of yellow sputum.  He denies shortness of breath currently but did have mild sob yesterday, denies nasal congestion, headache, neck pain or stiffness, chest pain, abdominal pain and diarrhea.  He has taken mucinex and robitussin cough formula without relief.  He is a smoker and reports positive exposure to a coworker last week with similar symptoms.      Past Medical History  Diagnosis Date  . Hernia   . Arm fracture, left   . Migraine    Past Surgical History  Procedure Laterality Date  . Hernia repair    . Hernia repair    . Left arm fracture     Family History  Problem Relation Age of Onset  . Hypertension Father   . Diabetes Father   . Hypertension Sister   . Diabetes Sister   . Hypertension Mother   . Hypertension Brother    Social History  Substance Use Topics  . Smoking status: Current Every Day Smoker -- 0.50 packs/day for 15 years    Types: Cigarettes  . Smokeless tobacco: Never Used  . Alcohol Use: No    Review of Systems  Constitutional: Positive for fever and chills.  HENT: Positive for sore throat. Negative for congestion, ear pain, rhinorrhea, sinus pressure, trouble swallowing and voice change.   Eyes: Negative for discharge.  Respiratory: Positive for cough and shortness of breath. Negative for wheezing and stridor.   Cardiovascular: Negative for chest pain.  Gastrointestinal: Positive for nausea. Negative for vomiting and abdominal pain.  Genitourinary:  Negative.   Musculoskeletal: Positive for myalgias.      Allergies  Penicillins; Bee venom; Clindamycin/lincomycin; and Cranberry  Home Medications   Prior to Admission medications   Medication Sig Start Date End Date Taking? Authorizing Provider  docusate sodium (COLACE) 100 MG capsule Take 1 capsule (100 mg total) by mouth every 12 (twelve) hours. 03/26/14   Charlestine Night, PA-C  ibuprofen (ADVIL,MOTRIN) 200 MG tablet Take 200 mg by mouth every 6 (six) hours as needed for moderate pain.     Historical Provider, MD  metroNIDAZOLE (FLAGYL) 500 MG tablet Take 1 tablet (500 mg total) by mouth 2 (two) times daily. 09/16/14   Burgess Amor, PA-C  promethazine (PHENERGAN) 25 MG tablet Take 1 tablet (25 mg total) by mouth every 8 (eight) hours as needed for nausea or vomiting. 03/26/14   Charlestine Night, PA-C  traMADol (ULTRAM) 50 MG tablet Take 1 tablet (50 mg total) by mouth every 6 (six) hours as needed. 03/26/14   Christopher Lawyer, PA-C   BP 120/78 mmHg  Pulse 88  Temp(Src) 98.1 F (36.7 C) (Oral)  Resp 16  Ht  (1.702 m)  Wt 90.719 kg  BMI 31.32 kg/m2  SpO2 100% Physical Exam  Constitutional: He is oriented to person, place, and time. He appears well-developed and well-nourished.  HENT:  Head: Normocephalic and atraumatic.  Right Ear: Tympanic membrane and ear canal normal.  Left Ear: Tympanic membrane and ear canal normal.  Nose: No mucosal edema  or rhinorrhea.  Mouth/Throat: Uvula is midline, oropharynx is clear and moist and mucous membranes are normal. No oropharyngeal exudate, posterior oropharyngeal edema, posterior oropharyngeal erythema or tonsillar abscesses.  Mild posterior pharyngeal erythema, strawberry tongue.  Eyes: Conjunctivae are normal.  Cardiovascular: Normal rate and normal heart sounds.   Pulmonary/Chest: Effort normal. No respiratory distress. He has no wheezes. He has rhonchi. He has no rales.  Subtle rhonchi right base, clears with cough.   Abdominal: Soft. There is no tenderness.  Musculoskeletal: Normal range of motion.  Neurological: He is alert and oriented to person, place, and time.  Skin: Skin is warm and dry. No rash noted.  Psychiatric: He has a normal mood and affect.    ED Course  Procedures (including critical care time) Labs Review Labs Reviewed  RAPID STREP SCREEN (NOT AT [facility])  CULTURE, GROUP A STREP [facility])    Imaging Review Dg Chest 2 View  03/25/2015  CLINICAL DATA:  Body aches. Chills. Fever, sore throat and congestion. Productive cough. EXAM: CHEST  2 VIEW COMPARISON:  03/26/2008 FINDINGS: Grossly unchanged cardiac silhouette and mediastinal contours. No focal parenchymal opacities. No pleural effusion or pneumothorax. No evidence of edema. No acute osseus abnormalities. IMPRESSION: No acute cardiopulmonary disease. Specifically, no evidence of pneumonia. Electronically Signed   By: Simonne Come M.D.   On: 03/25/2015 08:34   I have personally reviewed and evaluated these images and lab results as part of my medical decision-making.   EKG Interpretation None      MDM  Pt with sx suggestive viral syndrome/ flu like sx.  cxr to rule out pneumonia, rapid strep test given exam findings.  Pt given ibuprofen, zofran in dept, tolerated PO challenge.   Final diagnoses:  Flu-like symptoms    Imaging and labs reviewed and discussed with patient.  Symptoms suggesting viral URI, possible mild influenza.  He was encouraged rest, increase fluid intake, Tylenol or Motrin when necessary for symptom relief.  He was prescribed Tessalon for cough relief.  When necessary follow-up anticipated.    Burgess Amor, PA-C 03/26/15 9604  Glynn Octave, MD 03/26/15 [phone]

## 2015-03-25 NOTE — ED Notes (Signed)
Body aches, chills, fever, sore throat, congestion. Productive cough (yellow-brown in color). Sent home from work yesterday, unable to return to work today.

## 2015-03-25 NOTE — ED Notes (Signed)
Tolerating po fluids well.   

## 2015-03-27 LAB — CULTURE, GROUP A STREP (THRC)

## 2015-05-07 ENCOUNTER — Encounter (HOSPITAL_COMMUNITY): Payer: Self-pay | Admitting: *Deleted

## 2015-05-07 ENCOUNTER — Emergency Department (HOSPITAL_COMMUNITY)
Admission: EM | Admit: 2015-05-07 | Discharge: 2015-05-07 | Disposition: A | Discharge status: Home / Self Care | Payer: Self-pay | Attending: Emergency Medicine | Admitting: Emergency Medicine

## 2015-05-07 DIAGNOSIS — F17210 Nicotine dependence, cigarettes, uncomplicated: Secondary | ICD-10-CM | POA: Insufficient documentation

## 2015-05-07 DIAGNOSIS — N342 Other urethritis: Secondary | ICD-10-CM | POA: Insufficient documentation

## 2015-05-07 LAB — URINALYSIS, ROUTINE W REFLEX MICROSCOPIC
GLUCOSE, UA: NEGATIVE mg/dL
HGB URINE DIPSTICK: NEGATIVE
KETONES UR: NEGATIVE mg/dL
Nitrite: NEGATIVE
PH: 7.5 (ref 5.0–8.0)
Protein, ur: 30 mg/dL — AB
Specific Gravity, Urine: 1.01 (ref 1.005–1.030)

## 2015-05-07 LAB — URINE MICROSCOPIC-ADD ON: Squamous Epithelial / LPF: NONE SEEN

## 2015-05-07 MED ORDER — GENTAMICIN SULFATE 40 MG/ML IJ SOLN
INTRAMUSCULAR | Status: AC
  Filled 2015-05-07: qty 2

## 2015-05-07 MED ORDER — GENTAMICIN SULFATE 40 MG/ML IJ SOLN
240.0000 mg | Freq: Once | INTRAMUSCULAR | Status: AC
  Administered 2015-05-07: 240 mg via INTRAMUSCULAR
  Filled 2015-05-07: qty 6

## 2015-05-07 MED ORDER — AZITHROMYCIN 250 MG PO TABS
2000.0000 mg | ORAL_TABLET | Freq: Once | ORAL | Status: AC
  Administered 2015-05-07: 2000 mg via ORAL
  Filled 2015-05-07: qty 8

## 2015-05-07 MED ORDER — ONDANSETRON 8 MG PO TBDP
8.0000 mg | ORAL_TABLET | Freq: Once | ORAL | Status: AC
  Administered 2015-05-07: 8 mg via ORAL
  Filled 2015-05-07: qty 1

## 2015-05-07 NOTE — ED Notes (Signed)
Pt c/o burning with urination that started x 2 days ago
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
## 2015-05-07 NOTE — ED Notes (Signed)
Pt alert & oriented x4, stable gait. Patient given discharge instructions, paperwork & prescription(s). Patient  instructed to stop at the registration desk to finish any additional paperwork. Patient verbalized understanding. Pt left department w/ no further questions. 

## 2015-05-07 NOTE — Discharge Instructions (Signed)
You will be called if your STD tests are positive. Your sexual partners should be checked. Recheck if you continue to have symptoms, you get abdominal pain, fever, nausea or vomiting.

## 2015-05-08 LAB — GC/CHLAMYDIA PROBE AMP (~~LOC~~) NOT AT ARMC
CHLAMYDIA, DNA PROBE: NEGATIVE
Neisseria Gonorrhea: NEGATIVE

## 2015-05-08 LAB — HIV ANTIBODY (ROUTINE TESTING W REFLEX): HIV Screen 4th Generation wRfx: NONREACTIVE

## 2015-05-08 LAB — RPR: RPR: NONREACTIVE

## 2015-09-17 ENCOUNTER — Emergency Department (HOSPITAL_COMMUNITY)
Admission: EM | Admit: 2015-09-17 | Discharge: 2015-09-17 | Disposition: A | Discharge status: Home / Self Care | Payer: Self-pay | Attending: Emergency Medicine | Admitting: Emergency Medicine

## 2015-09-17 ENCOUNTER — Encounter (HOSPITAL_COMMUNITY): Payer: Self-pay | Admitting: *Deleted

## 2015-09-17 DIAGNOSIS — K0889 Other specified disorders of teeth and supporting structures: Secondary | ICD-10-CM

## 2015-09-17 DIAGNOSIS — K029 Dental caries, unspecified: Secondary | ICD-10-CM | POA: Insufficient documentation

## 2015-09-17 DIAGNOSIS — F17210 Nicotine dependence, cigarettes, uncomplicated: Secondary | ICD-10-CM | POA: Insufficient documentation

## 2015-09-17 MED ORDER — CLINDAMYCIN HCL 150 MG PO CAPS
300.0000 mg | ORAL_CAPSULE | Freq: Once | ORAL | Status: AC
  Administered 2015-09-17: 300 mg via ORAL
  Filled 2015-09-17: qty 2

## 2015-09-17 MED ORDER — DIPHENHYDRAMINE HCL 25 MG PO CAPS
25.0000 mg | ORAL_CAPSULE | Freq: Once | ORAL | Status: AC
  Administered 2015-09-17: 25 mg via ORAL
  Filled 2015-09-17: qty 1

## 2015-09-17 MED ORDER — IBUPROFEN 800 MG PO TABS: 800.0000 mg | ORAL_TABLET | Freq: Three times a day (TID) | ORAL | 0 refills | Status: DC

## 2015-09-17 MED ORDER — CLINDAMYCIN HCL 150 MG PO CAPS: 150.0000 mg | ORAL_CAPSULE | Freq: Four times a day (QID) | ORAL | 0 refills | Status: DC

## 2015-09-17 MED ORDER — IBUPROFEN 800 MG PO TABS
800.0000 mg | ORAL_TABLET | Freq: Once | ORAL | Status: AC
  Administered 2015-09-17: 800 mg via ORAL
  Filled 2015-09-17: qty 1

## 2015-09-17 NOTE — ED Triage Notes (Signed)
Pt c/o tooth pain to left side of mouth; pt states he is unable to eat

## 2015-09-17 NOTE — ED Provider Notes (Signed)
[facility]-EM[facility]Y DEPT Provider Note   CSN: [id number] Arrival date & time: 09/17/15  2000     History   Chief Complaint Chief Complaint  Patient presents with  . Dental Pain    HPI Miguel Robbins is a 35 y.o. male.  HPI  Miguel Robbins is a 35 y.o. male who presents to the Emergency Department complaining of Left upper and lower dental pain for one week. He reports having dental cavities for some time. Pain is worse with chewing. He states he has taken over-the-counter Tylenol without relief. He denies facial swelling, fever, difficulty swallowing and neck pain. He states he does not have a current dentist   Past Medical History:  Diagnosis Date  . Arm fracture, left   . Hernia   . Migraine     There are no active problems to display for this patient.   Past Surgical History:  Procedure Laterality Date  . HERNIA REPAIR    . HERNIA REPAIR    . left arm fracture         Home Medications    Prior to Admission medications   Medication Sig Start Date End Date Taking? Authorizing Provider  benzonatate (TESSALON) 100 MG capsule Take 2 capsules (200 mg total) by mouth 3 (three) times daily as needed. 03/25/15   Burgess AmorJulie Idol, PA-C  docusate sodium (COLACE) 100 MG capsule Take 1 capsule (100 mg total) by mouth every 12 (twelve) hours. 03/26/14   Charlestine Nighthristopher Lawyer, PA-C  ibuprofen (ADVIL,MOTRIN) 200 MG tablet Take 200 mg by mouth every 6 (six) hours as needed for moderate pain.     Historical Provider, MD  metroNIDAZOLE (FLAGYL) 500 MG tablet Take 1 tablet (500 mg total) by mouth 2 (two) times daily. 09/16/14   Burgess AmorJulie Idol, PA-C  promethazine (PHENERGAN) 25 MG tablet Take 1 tablet (25 mg total) by mouth every 8 (eight) hours as needed for nausea or vomiting. 03/26/14   Charlestine Nighthristopher Lawyer, PA-C  traMADol (ULTRAM) 50 MG tablet Take 1 tablet (50 mg total) by mouth every 6 (six) hours as needed. 03/26/14   Charlestine Nighthristopher Lawyer, PA-C    Family History Family History  Problem  Relation Age of Onset  . Hypertension Father   . Diabetes Father   . Hypertension Sister   . Diabetes Sister   . Hypertension Mother   . Hypertension Brother     Social History Social History  Substance Use Topics  . Smoking status: Current Every Day Smoker    Packs/day: 0.50    Years: 15.00    Types: Cigarettes  . Smokeless tobacco: Never Used  . Alcohol use No     Allergies   Penicillins; Bee venom; Clindamycin/lincomycin; and Cranberry   Review of Systems Review of Systems  Constitutional: Negative for appetite change and fever.  HENT: Positive for dental problem. Negative for congestion, facial swelling, sore throat and trouble swallowing.   Eyes: Negative for pain and visual disturbance.  Musculoskeletal: Negative for neck pain and neck stiffness.  Neurological: Negative for dizziness, facial asymmetry and headaches.  Hematological: Negative for adenopathy.  All other systems reviewed and are negative.    Physical Exam Updated Vital Signs BP (!) 179/101 (BP Location: Left Arm)   Pulse 62   Temp 99.2 F (37.3 C) (Oral)   Resp 18   Ht 5\' 7"  (1.702 m)   Wt 81.6 kg   SpO2 100%   BMI 28.19 kg/m   Physical Exam  Constitutional: He is oriented to person, place,  and time. He appears well-developed and well-nourished. No distress.  HENT:  Head: Normocephalic and atraumatic.  Right Ear: Tympanic membrane and ear canal normal.  Left Ear: Tympanic membrane and ear canal normal.  Mouth/Throat: Uvula is midline, oropharynx is clear and moist and mucous membranes are normal. No trismus in the jaw. Dental caries present. No dental abscesses or uvula swelling.  ttp and dental caries of the left lower second premolar and left upper third molar. No facial swelling, obvious dental abscess, trismus, or sublingual abnml.    Neck: Normal range of motion. Neck supple.  Cardiovascular: Normal rate, regular rhythm and normal heart sounds.   No murmur heard. Pulmonary/Chest:  Effort normal and breath sounds normal.  Musculoskeletal: Normal range of motion.  Lymphadenopathy:    He has no cervical adenopathy.  Neurological: He is alert and oriented to person, place, and time. He exhibits normal muscle tone. Coordination normal.  Skin: Skin is warm and dry.  Nursing note and vitals reviewed.    ED Treatments / Results  Labs (all labs ordered are listed, but only abnormal results are displayed) Labs Reviewed - No data to display  EKG  EKG Interpretation None       Radiology No results found.  Procedures Procedures (including critical care time)  Medications Ordered in ED Medications - No data to display   Initial Impression / Assessment and Plan / ED Course  I have reviewed the triage vital signs and the nursing notes.  Pertinent labs & imaging results that were available during my care of the patient were reviewed by me and considered in my medical decision making (see chart for details).  Clinical Course    Patient is well appearing. Nontoxic. No obvious dental abscess, no concerning symptoms for Ludwig's angina.  Dental referral list given.    In the clindamycin was listed as allergy, but patient states he does not have an allergy to clindamycin. Dose given in the ED along with Benadryl without complication. Elevated blood pressure is believe to be secondary to pain, denies any sx's at present other than dental pain.    Final Clinical Impressions(s) / ED Diagnoses   Final diagnoses:  Pain, dental    New Prescriptions New Prescriptions   CLINDAMYCIN (CLEOCIN) 150 MG CAPSULE    Take 1 capsule (150 mg total) by mouth 4 (four) times daily. For 7 days   IBUPROFEN (ADVIL,MOTRIN) 800 MG TABLET    Take 1 tablet (800 mg total) by mouth 3 (three) times daily.     Rosey Bathammy Dawnn Nam, PA-C 09/17/15 2039    Jacalyn LefevreJulie Haviland, MD 09/17/15 [phone]

## 2015-09-17 NOTE — Discharge Instructions (Signed)
Call one of the dentists on the list provided to arrange an appt

## 2015-12-15 ENCOUNTER — Emergency Department (HOSPITAL_COMMUNITY): Payer: Self-pay

## 2015-12-15 ENCOUNTER — Emergency Department (HOSPITAL_COMMUNITY)
Admission: EM | Admit: 2015-12-15 | Discharge: 2015-12-15 | Disposition: A | Discharge status: Home / Self Care | Payer: Self-pay | Attending: Emergency Medicine | Admitting: Emergency Medicine

## 2015-12-15 ENCOUNTER — Encounter (HOSPITAL_COMMUNITY): Payer: Self-pay

## 2015-12-15 DIAGNOSIS — F17210 Nicotine dependence, cigarettes, uncomplicated: Secondary | ICD-10-CM | POA: Insufficient documentation

## 2015-12-15 DIAGNOSIS — K8020 Calculus of gallbladder without cholecystitis without obstruction: Secondary | ICD-10-CM | POA: Insufficient documentation

## 2015-12-15 DIAGNOSIS — J069 Acute upper respiratory infection, unspecified: Secondary | ICD-10-CM | POA: Insufficient documentation

## 2015-12-15 DIAGNOSIS — R1010 Upper abdominal pain, unspecified: Secondary | ICD-10-CM

## 2015-12-15 LAB — CBC
HCT: 41 % (ref 39.0–52.0)
Hemoglobin: 14.1 g/dL (ref 13.0–17.0)
MCH: 31 pg (ref 26.0–34.0)
MCHC: 34.4 g/dL (ref 30.0–36.0)
MCV: 90.1 fL (ref 78.0–100.0)
PLATELETS: 309 10*3/uL (ref 150–400)
RBC: 4.55 MIL/uL (ref 4.22–5.81)
RDW: 13.2 % (ref 11.5–15.5)
WBC: 6.8 10*3/uL (ref 4.0–10.5)

## 2015-12-15 LAB — COMPREHENSIVE METABOLIC PANEL
ALT: 15 U/L — ABNORMAL LOW (ref 17–63)
AST: 14 U/L — AB (ref 15–41)
Albumin: 4.3 g/dL (ref 3.5–5.0)
Alkaline Phosphatase: 45 U/L (ref 38–126)
Anion gap: 6 (ref 5–15)
BUN: 10 mg/dL (ref 6–20)
CHLORIDE: 108 mmol/L (ref 101–111)
CO2: 25 mmol/L (ref 22–32)
Calcium: 9.3 mg/dL (ref 8.9–10.3)
Creatinine, Ser: 0.69 mg/dL (ref 0.61–1.24)
Glucose, Bld: 104 mg/dL — ABNORMAL HIGH (ref 65–99)
POTASSIUM: 3.6 mmol/L (ref 3.5–5.1)
SODIUM: 139 mmol/L (ref 135–145)
Total Bilirubin: 0.5 mg/dL (ref 0.3–1.2)
Total Protein: 7.6 g/dL (ref 6.5–8.1)

## 2015-12-15 LAB — LIPASE, BLOOD: LIPASE: 45 U/L (ref 11–51)

## 2015-12-15 MED ORDER — ONDANSETRON 8 MG PO TBDP: 8.0000 mg | ORAL_TABLET | Freq: Three times a day (TID) | ORAL | 0 refills | Status: DC | PRN

## 2015-12-15 MED ORDER — IBUPROFEN 600 MG PO TABS: 600.0000 mg | ORAL_TABLET | Freq: Three times a day (TID) | ORAL | 0 refills | Status: DC | PRN

## 2015-12-15 NOTE — ED Provider Notes (Deleted)
[facility] Provider Note   CSN: [id number] Arrival date & time: 12/15/15  0754  By signing my name below, I, Clovis PuAvnee Patel, attest that this documentation has been prepared under the direction and in the presence of Azalia BilisKevin Vicci Reder, MD  Electronically Signed: Clovis PuAvnee Patel, ED Scribe. 12/15/15. 8:34 AM.   History   Chief Complaint Chief Complaint  Patient presents with  . Abdominal Pain    The history is provided by the patient. No language interpreter was used.   HPI Comments:  Maximilien L [place] is a 35 y.o. male who presents to the Emergency Department complaining of sudden onset, intermittent, sharp upper abdominal pain x 1 week. He also notes a persistent cough with phlegm. Pt notes he was experiencing cold-like symptoms which initially began x 1 week which resolved and have now come back. No alleviating factors noted. Pt denies fevers, nausea, a hx of abdominal surgery and a hx of asthma. He notes he quit drinking 1 year ago. Pt denies any other symptoms or complaints at this time.  Past Medical History:  Diagnosis Date  . Arm fracture, left   . Hernia   . Migraine     There are no active problems to display for this patient.   Past Surgical History:  Procedure Laterality Date  . HERNIA REPAIR    . HERNIA REPAIR    . left arm fracture      Home Medications    Prior to Admission medications   Medication Sig Start Date End Date Taking? Authorizing Provider  benzonatate (TESSALON) 100 MG capsule Take 2 capsules (200 mg total) by mouth 3 (three) times daily as needed. 03/25/15   Burgess AmorJulie Idol, PA-C  clindamycin (CLEOCIN) 150 MG capsule Take 1 capsule (150 mg total) by mouth 4 (four) times daily. For 7 days 09/17/15   Tammy Triplett, PA-C  docusate sodium (COLACE) 100 MG capsule Take 1 capsule (100 mg total) by mouth every 12 (twelve) hours. 03/26/14   Charlestine Nighthristopher Lawyer, PA-C  ibuprofen (ADVIL,MOTRIN) 800 MG tablet Take 1 tablet (800 mg total) by mouth 3 (three) times  daily. 09/17/15   Tammy Triplett, PA-C  metroNIDAZOLE (FLAGYL) 500 MG tablet Take 1 tablet (500 mg total) by mouth 2 (two) times daily. 09/16/14   Burgess AmorJulie Idol, PA-C  promethazine (PHENERGAN) 25 MG tablet Take 1 tablet (25 mg total) by mouth every 8 (eight) hours as needed for nausea or vomiting. 03/26/14   Charlestine Nighthristopher Lawyer, PA-C  traMADol (ULTRAM) 50 MG tablet Take 1 tablet (50 mg total) by mouth every 6 (six) hours as needed. 03/26/14   Charlestine Nighthristopher Lawyer, PA-C    Family History Family History  Problem Relation Age of Onset  . Hypertension Father   . Diabetes Father   . Hypertension Sister   . Diabetes Sister   . Hypertension Mother   . Hypertension Brother     Social History Social History  Substance Use Topics  . Smoking status: Current Every Day Smoker    Packs/day: 0.50    Years: 15.00    Types: Cigarettes  . Smokeless tobacco: Never Used  . Alcohol use No     Allergies   Penicillins; Bee venom; Clindamycin/lincomycin; and Cranberry   Review of Systems Review of Systems  Constitutional: Negative for fever.  Respiratory: Positive for cough.   Gastrointestinal: Positive for abdominal pain. Negative for nausea.  10 systems reviewed and all are negative for acute change except as noted in the HPI.  Physical Exam Updated Vital Signs BP  138/85 (BP Location: Left Arm)   Pulse 72   Temp 98.1 F (36.7 C) (Oral)   Resp 20   Ht 5\' 7"  (1.702 m)   Wt 210 lb (95.3 kg)   SpO2 95%   BMI 32.89 kg/m   Physical Exam  Constitutional: He is oriented to person, place, and time. He appears well-developed and well-nourished.  HENT:  Head: Normocephalic and atraumatic.  Eyes: EOM are normal.  Neck: Normal range of motion.  Cardiovascular: Normal rate, regular rhythm, normal heart sounds and intact distal pulses.   Pulmonary/Chest: Effort normal and breath sounds normal. No respiratory distress.  Abdominal: Soft. He exhibits no distension. There is no tenderness.  Mild  epigastric pain.  Musculoskeletal: Normal range of motion.  Neurological: He is alert and oriented to person, place, and time.  Skin: Skin is warm and dry.  Psychiatric: He has a normal mood and affect. Judgment normal.  Nursing note and vitals reviewed.   ED Treatments / Results  DIAGNOSTIC STUDIES:  Oxygen Saturation is 95% on RA, normal by my interpretation.    COORDINATION OF CARE:  8:28 AM Discussed treatment plan with pt at bedside and pt agreed to plan.  Labs (all labs ordered are listed, but only abnormal results are displayed) Labs Reviewed  COMPREHENSIVE METABOLIC PANEL - Abnormal; Notable for the following:       Result Value   Glucose, Bld 104 (*)    AST 14 (*)    ALT 15 (*)    All other components within normal limits  CBC  LIPASE, BLOOD    EKG  EKG Interpretation None       Radiology Dg Chest 2 View  Result Date: 12/15/2015 CLINICAL DATA:  Productive cough. EXAM: CHEST  2 VIEW COMPARISON:  03/25/2015 FINDINGS: The heart size and mediastinal contours are within normal limits. There is no evidence of pulmonary edema, consolidation, pneumothorax, nodule or pleural fluid. The visualized skeletal structures are unremarkable. IMPRESSION: No active cardiopulmonary disease. Electronically Signed   By: Irish LackGlenn  Yamagata M.D.   On: 12/15/2015 08:49   [place] Abdomen Limited Ruq  Result Date: 12/15/2015 CLINICAL DATA:  Upper abdominal pain EXAM: US ABDOMEN LIMITED - RIGHT UPPER QUADRANT COMPARISON:  03/26/2014 FINDINGS: Gallbladder: Stone within the neck of the gallbladder is identified measuring 1.8 cm. Gallbladder wall is increased in caliber measuring 5 mm. Positive sonographic Murphy's sign. Common bile duct: Diameter: 6 mm Liver: No focal lesion identified. Within normal limits in parenchymal echogenicity. IMPRESSION: 1. Gallbladder wall thickening. Stone within the neck of gallbladder is noted and there is a positive sonographic Murphy's sign. Findings concerning for  acute cholecystitis. Electronically Signed   By: Signa Kellaylor  Stroud M.D.   On: 12/15/2015 09:42    Procedures Procedures (including critical care time)  Medications Ordered in ED Medications - No data to display   Initial Impression / Assessment and Plan / ED Course  I have reviewed the triage vital signs and the nursing notes.  Pertinent labs & imaging results that were available during my care of the patient were reviewed by me and considered in my medical decision making (see chart for details).  Clinical Course     Patient without discomfort at this time.  No tenderness in the right upper quadrant my examination.  Clinically there is no signs to suggest cholecystitis.  He does have symptoms consistent with biliary colic and has a gallstone noted on ultrasound today.  He'll be referred to general surgery for outpatient follow-up.  He understands to return to the ER for worsening pain or development of fever.  In regards to his cough he appears have an upper respiratory tract infection.  Chest x-ray is negative for pneumonia.  White blood cell count is normal.  Patient be discharged home from the ER.  Final Clinical Impressions(s) / ED Diagnoses   Final diagnoses:  Upper abdominal pain  Calculus of gallbladder without cholecystitis without obstruction  Upper respiratory tract infection, unspecified type    New Prescriptions New Prescriptions   IBUPROFEN (ADVIL,MOTRIN) 600 MG TABLET    Take 1 tablet (600 mg total) by mouth every 8 (eight) hours as needed.   ONDANSETRON (ZOFRAN ODT) 8 MG DISINTEGRATING TABLET    Take 1 tablet (8 mg total) by mouth every 8 (eight) hours as needed for nausea or vomiting.   I personally performed the services described in this documentation, which was scribed in my presence. The recorded information has been reviewed and is accurate.        Azalia Bilis, MD 12/15/15 1013

## 2015-12-15 NOTE — ED Provider Notes (Signed)
[facility]Y DEPT Provider Note   CSN: [id number] Arrival date & time: 12/15/15  0754  By signing my name below, I, Clovis Pu, attest that this documentation has been prepared under the direction and in the presence of Azalia Bilis, MD  Electronically Signed: Clovis Pu, ED Scribe. 12/15/15. 8:34 AM.   History              Chief Complaint    Chief Complaint  Patient presents with  . Abdominal Pain    The history is provided by the patient. No language interpreter was used.   HPI Comments:  Miguel Robbins is a 35 y.o. male who presents to the Emergency Department complaining of sudden onset, intermittent, sharp upper abdominal pain x 1 week. He also notes a persistent cough with phlegm. Pt notes he was experiencing cold-like symptoms which initially began x 1 week which resolved and have now come back. No alleviating factors noted. Pt denies fevers, nausea, a hx of abdominal surgery and a hx of asthma. He notes he quit drinking 1 year ago. Pt denies any other symptoms or complaints at this time.      Past Medical History:  Diagnosis Date  . Arm fracture, left   . Hernia   . Migraine     There are no active problems to display for this patient.        Past Surgical History:  Procedure Laterality Date  . HERNIA REPAIR    . HERNIA REPAIR    . left arm fracture      Home Medications                      Prior to Admission medications   Medication Sig Start Date End Date Taking? Authorizing Provider  benzonatate (TESSALON) 100 MG capsule Take 2 capsules (200 mg total) by mouth 3 (three) times daily as needed. 03/25/15   Burgess Amor, PA-C  clindamycin (CLEOCIN) 150 MG capsule Take 1 capsule (150 mg total) by mouth 4 (four) times daily. For 7 days 09/17/15   Tammy Triplett, PA-C  docusate sodium (COLACE) 100 MG capsule Take 1 capsule (100 mg total) by mouth every 12 (twelve) hours. 03/26/14   Charlestine Night, PA-C  ibuprofen  (ADVIL,MOTRIN) 800 MG tablet Take 1 tablet (800 mg total) by mouth 3 (three) times daily. 09/17/15   Tammy Triplett, PA-C  metroNIDAZOLE (FLAGYL) 500 MG tablet Take 1 tablet (500 mg total) by mouth 2 (two) times daily. 09/16/14   Burgess Amor, PA-C  promethazine (PHENERGAN) 25 MG tablet Take 1 tablet (25 mg total) by mouth every 8 (eight) hours as needed for nausea or vomiting. 03/26/14   Charlestine Night, PA-C  traMADol (ULTRAM) 50 MG tablet Take 1 tablet (50 mg total) by mouth every 6 (six) hours as needed. 03/26/14   Charlestine Night, PA-C    Family History      Family History  Problem Relation Age of Onset  . Hypertension Father   . Diabetes Father   . Hypertension Sister   . Diabetes Sister   . Hypertension Mother   . Hypertension Brother     Social History      Social History  Substance Use Topics  . Smoking status: Current Every Day Smoker    Packs/day: 0.50    Years: 15.00    Types: Cigarettes  . Smokeless tobacco: Never Used  . Alcohol use No     Allergies  Penicillins; Bee venom; Clindamycin/lincomycin; and Cranberry   Review of Systems Review of Systems  Constitutional: Negative for fever.  Respiratory: Positive for cough.   Gastrointestinal: Positive for abdominal pain. Negative for nausea.  10 systems reviewed and all are negative for acute change except as noted in the HPI.  Physical Exam Updated Vital Signs BP 138/85 (BP Location: Left Arm)   Pulse 72   Temp 98.1 F (36.7 C) (Oral)   Resp 20   Ht 5\' 7"  (1.702 m)   Wt 210 lb (95.3 kg)   SpO2 95%   BMI 32.89 kg/m   Physical Exam  Constitutional: He is oriented to person, place, and time. He appears well-developed and well-nourished.  HENT:  Head: Normocephalic and atraumatic.  Eyes: EOM are normal.  Neck: Normal range of motion.  Cardiovascular: Normal rate, regular rhythm, normal heart sounds and intact distal pulses.   Pulmonary/Chest: Effort normal and  breath sounds normal. No respiratory distress.  Abdominal: Soft. He exhibits no distension. There is no tenderness.  Mild epigastric pain.  Musculoskeletal: Normal range of motion.  Neurological: He is alert and oriented to person, place, and time.  Skin: Skin is warm and dry.  Psychiatric: He has a normal mood and affect. Judgment normal.  Nursing note and vitals reviewed.   ED Treatments / Results  DIAGNOSTIC STUDIES:  Oxygen Saturation is 95% on RA, normal by my interpretation.    COORDINATION OF CARE:  8:28 AM Discussed treatment plan with pt at bedside and pt agreed to plan.  Labs (all labs ordered are listed, but only abnormal results are displayed)      Labs Reviewed  COMPREHENSIVE METABOLIC PANEL - Abnormal; Notable for the following:       Result Value    Glucose, Bld 104 (*)    AST 14 (*)    ALT 15 (*)    All other components within normal limits  CBC  LIPASE, BLOOD    EKG      EKG Interpretation None      Radiology Dg Chest 2 View  Result Date: 12/15/2015 CLINICAL DATA:  Productive cough. EXAM: CHEST  2 VIEW COMPARISON:  03/25/2015 FINDINGS: The heart size and mediastinal contours are within normal limits. There is no evidence of pulmonary edema, consolidation, pneumothorax, nodule or pleural fluid. The visualized skeletal structures are unremarkable. IMPRESSION: No active cardiopulmonary disease. Electronically Signed   By: Irish LackGlenn  Yamagata M.D.   On: 12/15/2015 08:49   [place] Abdomen Limited Ruq  Result Date: 12/15/2015 CLINICAL DATA:  Upper abdominal pain EXAM: US ABDOMEN LIMITED - RIGHT UPPER QUADRANT COMPARISON:  03/26/2014 FINDINGS: Gallbladder: Stone within the neck of the gallbladder is identified measuring 1.8 cm. Gallbladder wall is increased in caliber measuring 5 mm. Positive sonographic Murphy's sign. Common bile duct: Diameter: 6 mm Liver: No focal lesion identified. Within normal limits in parenchymal echogenicity.  IMPRESSION: 1. Gallbladder wall thickening. Stone within the neck of gallbladder is noted and there is a positive sonographic Murphy's sign. Findings concerning for acute cholecystitis. Electronically Signed   By: Signa Kellaylor  Stroud M.D.   On: 12/15/2015 09:42    Procedures Procedures (including critical care time)  Medications Ordered in ED Medications - No data to display   Initial Impression / Assessment and Plan / ED Course  I have reviewed the triage vital signs and the nursing notes.  Pertinent labs & imaging results that were available during my care of the patient were reviewed by me and considered in my  medical decision making (see chart for details).  Clinical Course     Patient without discomfort at this time.  No tenderness in the right upper quadrant my examination.  Clinically there is no signs to suggest cholecystitis.  He does have symptoms consistent with biliary colic and has a gallstone noted on ultrasound today.  He'll be referred to general surgery for outpatient follow-up.  He understands to return to the ER for worsening pain or development of fever.  In regards to his cough he appears have an upper respiratory tract infection.  Chest x-ray is negative for pneumonia.  White blood cell count is normal.  Patient be discharged home from the ER.  Final Clinical Impressions(s) / ED Diagnoses   Final diagnoses:  Upper abdominal pain  Calculus of gallbladder without cholecystitis without obstruction  Upper respiratory tract infection, unspecified type    New Prescriptions     New Prescriptions   IBUPROFEN (ADVIL,MOTRIN) 600 MG TABLET    Take 1 tablet (600 mg total) by mouth every 8 (eight) hours as needed.   ONDANSETRON (ZOFRAN ODT) 8 MG DISINTEGRATING TABLET    Take 1 tablet (8 mg total) by mouth every 8 (eight) hours as needed for nausea or vomiting.   I personally performed the services described in this documentation, which was scribed in my presence.  The recorded information has been reviewed and is accurate.        Azalia BilisKevin Sondos Wolfman, MD 12/15/15 1013    Azalia BilisKevin Halford Goetzke, MD 12/15/15 1020

## 2015-12-15 NOTE — ED Triage Notes (Signed)
Pt reports had a cold last week that got better but says symptoms came back.  Reports productive cough and upper abd pain.  Denies any n/v/d.

## 2015-12-18 ENCOUNTER — Encounter (HOSPITAL_COMMUNITY): Payer: Self-pay | Admitting: *Deleted

## 2015-12-18 ENCOUNTER — Emergency Department (HOSPITAL_COMMUNITY)
Admission: EM | Admit: 2015-12-18 | Discharge: 2015-12-18 | Disposition: A | Discharge status: Home / Self Care | Payer: Self-pay | Attending: Emergency Medicine | Admitting: Emergency Medicine

## 2015-12-18 DIAGNOSIS — F17210 Nicotine dependence, cigarettes, uncomplicated: Secondary | ICD-10-CM | POA: Insufficient documentation

## 2015-12-18 DIAGNOSIS — Z791 Long term (current) use of non-steroidal anti-inflammatories (NSAID): Secondary | ICD-10-CM | POA: Insufficient documentation

## 2015-12-18 DIAGNOSIS — K8020 Calculus of gallbladder without cholecystitis without obstruction: Secondary | ICD-10-CM

## 2015-12-18 DIAGNOSIS — R1011 Right upper quadrant pain: Secondary | ICD-10-CM

## 2015-12-18 DIAGNOSIS — Z79899 Other long term (current) drug therapy: Secondary | ICD-10-CM | POA: Insufficient documentation

## 2015-12-18 LAB — COMPREHENSIVE METABOLIC PANEL
ALBUMIN: 4.4 g/dL (ref 3.5–5.0)
ALT: 15 U/L — ABNORMAL LOW (ref 17–63)
ANION GAP: 6 (ref 5–15)
AST: 15 U/L (ref 15–41)
Alkaline Phosphatase: 42 U/L (ref 38–126)
BILIRUBIN TOTAL: 0.7 mg/dL (ref 0.3–1.2)
BUN: 15 mg/dL (ref 6–20)
CO2: 26 mmol/L (ref 22–32)
Calcium: 9.4 mg/dL (ref 8.9–10.3)
Chloride: 106 mmol/L (ref 101–111)
Creatinine, Ser: 0.84 mg/dL (ref 0.61–1.24)
GFR calc Af Amer: >60 mL/min (ref >60)
Glucose, Bld: 116 mg/dL — ABNORMAL HIGH (ref 65–99)
POTASSIUM: 3.4 mmol/L — AB (ref 3.5–5.1)
Sodium: 138 mmol/L (ref 135–145)
TOTAL PROTEIN: 7.8 g/dL (ref 6.5–8.1)

## 2015-12-18 LAB — CBC WITH DIFFERENTIAL/PLATELET
BASOS PCT: 1 %
Basophils Absolute: 0 10*3/uL (ref 0.0–0.1)
EOS PCT: 2 %
Eosinophils Absolute: 0.1 10*3/uL (ref 0.0–0.7)
HEMATOCRIT: 43.9 % (ref 39.0–52.0)
Hemoglobin: 15.4 g/dL (ref 13.0–17.0)
Lymphocytes Relative: 40 %
Lymphs Abs: 3 10*3/uL (ref 0.7–4.0)
MCH: 31.8 pg (ref 26.0–34.0)
MCHC: 35.1 g/dL (ref 30.0–36.0)
MCV: 90.5 fL (ref 78.0–100.0)
MONO ABS: 0.4 10*3/uL (ref 0.1–1.0)
MONOS PCT: 5 %
NEUTROS ABS: 3.9 10*3/uL (ref 1.7–7.7)
Neutrophils Relative %: 52 %
Platelets: 315 10*3/uL (ref 150–400)
RBC: 4.85 MIL/uL (ref 4.22–5.81)
RDW: 13.1 % (ref 11.5–15.5)
WBC: 7.5 10*3/uL (ref 4.0–10.5)

## 2015-12-18 LAB — LIPASE, BLOOD: LIPASE: 45 U/L (ref 11–51)

## 2015-12-18 MED ORDER — HYDROCODONE-ACETAMINOPHEN 5-325 MG PO TABS: 1.0000 | ORAL_TABLET | Freq: Four times a day (QID) | ORAL | 0 refills | Status: DC | PRN

## 2015-12-18 MED ORDER — ONDANSETRON HCL 4 MG/2ML IJ SOLN
4.0000 mg | Freq: Once | INTRAMUSCULAR | Status: AC
  Administered 2015-12-18: 4 mg via INTRAVENOUS
  Filled 2015-12-18: qty 2

## 2015-12-18 MED ORDER — MORPHINE SULFATE (PF) 4 MG/ML IV SOLN
4.0000 mg | Freq: Once | INTRAVENOUS | Status: AC
  Administered 2015-12-18: 4 mg via INTRAVENOUS
  Filled 2015-12-18: qty 1

## 2015-12-18 MED ORDER — SODIUM CHLORIDE 0.9 % IV BOLUS (SEPSIS)
1000.0000 mL | Freq: Once | INTRAVENOUS | Status: AC
  Administered 2015-12-18: 1000 mL via INTRAVENOUS

## 2015-12-18 NOTE — ED Provider Notes (Signed)
TIME SEEN: 5:40 AM  CHIEF COMPLAINT: right upper quadrant abdominal pain  HPI: Pt is a [age] y.o. male who presents emergency department with right upper quadrant abdominal pain. Was seen here on November 12 same and found to have gallstones. Ultrasound was suggestive of cholecystitis but it did not fit his clinical picture. States he went home and ate ribs for dinner last night and has had pain ever since it has been uncontrolled. Was discharged with prescriptions for ibuprofen and Zofran but states these are not helping. Denies any current nausea, vomiting. No known fevers or chills. States he did follow-up with Dr. Lovell SheehanJenkins and states he cannot afford to have the surgery as an outpatient. States pain has been uncontrolled and constant for many hours.  States he was told to call someone in the hospital to help set up payment plan which he hasn't done yet.  ROS: See HPI Constitutional: no fever  Eyes: no drainage  ENT: no runny nose   Cardiovascular:  no chest pain  Resp: no SOB  GI: no vomiting GU: no dysuria Integumentary: no rash  Allergy: no hives  Musculoskeletal: no leg swelling  Neurological: no slurred speech ROS otherwise negative  PAST MEDICAL HISTORY/PAST SURGICAL HISTORY:  Past Medical History:  Diagnosis Date  . Arm fracture, left   . Hernia   . Migraine     MEDICATIONS:  Prior to Admission medications   Medication Sig Start Date End Date Taking? Authorizing Provider  ibuprofen (ADVIL,MOTRIN) 600 MG tablet Take 1 tablet (600 mg total) by mouth every 8 (eight) hours as needed. 12/15/15  Yes Azalia BilisKevin Campos, MD  ondansetron (ZOFRAN ODT) 8 MG disintegrating tablet Take 1 tablet (8 mg total) by mouth every 8 (eight) hours as needed for nausea or vomiting. 12/15/15  Yes Azalia BilisKevin Campos, MD    ALLERGIES:  Allergies  Allergen Reactions  . Penicillins Anaphylaxis    Swelling of throat  . Bee Venom Swelling  . Clindamycin/Lincomycin Itching  . Cranberry Itching    SOCIAL  HISTORY:  Social History  Substance Use Topics  . Smoking status: Current Every Day Smoker    Packs/day: 0.50    Years: 15.00    Types: Cigarettes  . Smokeless tobacco: Never Used  . Alcohol use No    FAMILY HISTORY: Family History  Problem Relation Age of Onset  . Hypertension Father   . Diabetes Father   . Hypertension Sister   . Diabetes Sister   . Hypertension Mother   . Hypertension Brother     EXAM: BP 133/85 (BP Location: Left Arm)   Pulse 61   Temp 98 F (36.7 C) (Oral)   Resp 18   Ht 5\' 7"  (1.702 m)   Wt 210 lb (95.3 kg)   SpO2 97%   BMI 32.89 kg/m  CONSTITUTIONAL: Alert and oriented and responds appropriately to questions. Well-appearing; well-nourished, Appears uncomfortable, afebrile and nontoxic HEAD: Normocephalic EYES: Conjunctivae clear, PERRL, EOMI ENT: normal nose; no rhinorrhea; moist mucous membranes NECK: Supple, no meningismus, no nuchal rigidity, no LAD  CARD: RRR; S1 and S2 appreciated; no murmurs, no clicks, no rubs, no gallops RESP: Normal chest excursion without splinting or tachypnea; breath sounds clear and equal bilaterally; no wheezes, no rhonchi, no rales, no hypoxia or respiratory distress, speaking full sentences ABD/GI: Normal bowel sounds; non-distended; soft, tender to palpation in the right upper quadrant with a positive Murphy sign, no rebound, no guarding, no peritoneal signs, no hepatosplenomegaly BACK:  The back appears normal and  is non-tender to palpation, there is no CVA tenderness EXT: Normal ROM in all joints; non-tender to palpation; no edema; normal capillary refill; no cyanosis, no calf tenderness or swelling    SKIN: Normal color for age and race; warm; no rash NEURO: Moves all extremities equally, sensation to light touch intact diffusely, cranial nerves II through XII intact, normal speech PSYCH: The patient's mood and manner are appropriate. Grooming and personal hygiene are appropriate.  MEDICAL DECISION MAKING: Pt  with a history of cholelithiasis who presents with worsening abdominal pain. No fevers or chills, nausea or vomiting. Ibuprofen, Zofran are not controlling his symptoms at home.We'll obtain labs, repeat ultrasound to evaluate for possible cholecystitis or choledocholithiasis. We'll give IV fluids, morphine, Zofran and reassess.  ED PROGRESS: 6:30 AM  Patient's labs are unremarkable. No leukocytosis. Normal LFTs. Normal lipase. He has markedly improved. This time I do not feel he needs a repeat ultrasound. My suspicion for cholecystitis or choledocholithiasis is much lower now given his normal labs, normal vitals and benign exam with a negative Murphy sign. He agrees that he would like to hold off on another ultrasound to try to save money. I do not feel he needs one emergently. We'll discharge him with prescription for Vicodin and have counseled him at length that he should avoid fatty, greasy meals as this will exacerbate his pain.  Discussed with him at length return precautions. He verbalizes understanding is comfortable with this plan.  I will have him follow-up with Dr. Lovell SheehanJenkins as an outpatient.   At this time, I do not feel there is any life-threatening condition present. I have reviewed and discussed all results (EKG, imaging, lab, urine as appropriate) and exam findings with patient/family. I have reviewed nursing notes and appropriate previous records.  I feel the patient is safe to be discharged home without further emergent workup and can continue workup as an outpatient as needed. Discussed usual and customary return precautions. Patient/family verbalize understanding and are comfortable with this plan.  Outpatient follow-up has been provided. All questions have been answered.      Layla MawKristen N Ronni Osterberg, DO 12/18/15 0700

## 2015-12-18 NOTE — ED Triage Notes (Signed)
Pt seen in the ER last week was told needed gall bladder removed. Pt says unable to afford. Pt had ribs last night & pain has been increased since.

## 2015-12-23 DIAGNOSIS — K8050 Calculus of bile duct without cholangitis or cholecystitis without obstruction: Secondary | ICD-10-CM | POA: Insufficient documentation

## 2015-12-23 DIAGNOSIS — F17210 Nicotine dependence, cigarettes, uncomplicated: Secondary | ICD-10-CM | POA: Insufficient documentation

## 2015-12-24 ENCOUNTER — Observation Stay (HOSPITAL_COMMUNITY)
Admission: AD | Admit: 2015-12-24 | Discharge: 2015-12-25 | Disposition: A | Discharge status: Home / Self Care | Payer: Self-pay | Source: Ambulatory Visit | Attending: Surgery | Admitting: Surgery

## 2015-12-24 ENCOUNTER — Ambulatory Visit (HOSPITAL_COMMUNITY)
Admission: RE | Admit: 2015-12-24 | Discharge: 2015-12-24 | Disposition: A | Discharge status: Home / Self Care | Payer: Self-pay | Source: Ambulatory Visit | Attending: Emergency Medicine | Admitting: Emergency Medicine

## 2015-12-24 ENCOUNTER — Emergency Department (HOSPITAL_COMMUNITY)
Admission: EM | Admit: 2015-12-24 | Discharge: 2015-12-24 | Disposition: A | Discharge status: Home / Self Care | Payer: Self-pay | Attending: Dermatology | Admitting: Dermatology

## 2015-12-24 ENCOUNTER — Encounter (HOSPITAL_COMMUNITY): Payer: Self-pay | Admitting: Emergency Medicine

## 2015-12-24 ENCOUNTER — Encounter (HOSPITAL_COMMUNITY): Payer: Self-pay | Admitting: Surgery

## 2015-12-24 ENCOUNTER — Emergency Department (HOSPITAL_COMMUNITY)
Admission: EM | Admit: 2015-12-24 | Discharge: 2015-12-24 | Disposition: A | Discharge status: Home / Self Care | Payer: Self-pay | Attending: Emergency Medicine | Admitting: Emergency Medicine

## 2015-12-24 DIAGNOSIS — R109 Unspecified abdominal pain: Secondary | ICD-10-CM | POA: Insufficient documentation

## 2015-12-24 DIAGNOSIS — K8012 Calculus of gallbladder with acute and chronic cholecystitis without obstruction: Principal | ICD-10-CM | POA: Insufficient documentation

## 2015-12-24 DIAGNOSIS — K8000 Calculus of gallbladder with acute cholecystitis without obstruction: Secondary | ICD-10-CM | POA: Diagnosis present

## 2015-12-24 DIAGNOSIS — R1011 Right upper quadrant pain: Secondary | ICD-10-CM

## 2015-12-24 DIAGNOSIS — F17210 Nicotine dependence, cigarettes, uncomplicated: Secondary | ICD-10-CM | POA: Insufficient documentation

## 2015-12-24 DIAGNOSIS — K8050 Calculus of bile duct without cholangitis or cholecystitis without obstruction: Secondary | ICD-10-CM

## 2015-12-24 DIAGNOSIS — K8020 Calculus of gallbladder without cholecystitis without obstruction: Secondary | ICD-10-CM | POA: Insufficient documentation

## 2015-12-24 DIAGNOSIS — Z5321 Procedure and treatment not carried out due to patient leaving prior to being seen by health care provider: Secondary | ICD-10-CM | POA: Insufficient documentation

## 2015-12-24 LAB — COMPREHENSIVE METABOLIC PANEL
ALT: 14 U/L — AB (ref 17–63)
AST: 16 U/L (ref 15–41)
Albumin: 4.4 g/dL (ref 3.5–5.0)
Alkaline Phosphatase: 41 U/L (ref 38–126)
Anion gap: 7 (ref 5–15)
BUN: 8 mg/dL (ref 6–20)
CHLORIDE: 107 mmol/L (ref 101–111)
CO2: 26 mmol/L (ref 22–32)
Calcium: 9.6 mg/dL (ref 8.9–10.3)
Creatinine, Ser: 0.78 mg/dL (ref 0.61–1.24)
Glucose, Bld: 115 mg/dL — ABNORMAL HIGH (ref 65–99)
POTASSIUM: 3.7 mmol/L (ref 3.5–5.1)
SODIUM: 140 mmol/L (ref 135–145)
Total Bilirubin: 0.4 mg/dL (ref 0.3–1.2)
Total Protein: 7.6 g/dL (ref 6.5–8.1)

## 2015-12-24 LAB — CBC WITH DIFFERENTIAL/PLATELET
BASOS ABS: 0 10*3/uL (ref 0.0–0.1)
Basophils Relative: 1 %
EOS ABS: 0.1 10*3/uL (ref 0.0–0.7)
EOS PCT: 2 %
HCT: 41.6 % (ref 39.0–52.0)
Hemoglobin: 14.1 g/dL (ref 13.0–17.0)
LYMPHS ABS: 3.4 10*3/uL (ref 0.7–4.0)
LYMPHS PCT: 50 %
MCH: 30.8 pg (ref 26.0–34.0)
MCHC: 33.9 g/dL (ref 30.0–36.0)
MCV: 90.8 fL (ref 78.0–100.0)
MONO ABS: 0.3 10*3/uL (ref 0.1–1.0)
Monocytes Relative: 5 %
Neutro Abs: 2.8 10*3/uL (ref 1.7–7.7)
Neutrophils Relative %: 42 %
PLATELETS: 294 10*3/uL (ref 150–400)
RBC: 4.58 MIL/uL (ref 4.22–5.81)
RDW: 13.2 % (ref 11.5–15.5)
WBC: 6.6 10*3/uL (ref 4.0–10.5)

## 2015-12-24 LAB — LIPASE, BLOOD: LIPASE: 32 U/L (ref 11–51)

## 2015-12-24 MED ORDER — ONDANSETRON 4 MG PO TBDP: 4.0000 mg | ORAL_TABLET | Freq: Three times a day (TID) | ORAL | 0 refills | Status: DC | PRN

## 2015-12-24 MED ORDER — ACETAMINOPHEN 325 MG PO TABS: 650.0000 mg | ORAL_TABLET | Freq: Four times a day (QID) | ORAL | Status: DC | PRN

## 2015-12-24 MED ORDER — KCL IN DEXTROSE-NACL 20-5-0.45 MEQ/L-%-% IV SOLN
INTRAVENOUS | Status: DC
  Administered 2015-12-24: 23:00:00 via INTRAVENOUS

## 2015-12-24 MED ORDER — MORPHINE SULFATE (PF) 4 MG/ML IV SOLN
4.0000 mg | Freq: Once | INTRAVENOUS | Status: AC
  Administered 2015-12-24: 4 mg via INTRAVENOUS
  Filled 2015-12-24: qty 1

## 2015-12-24 MED ORDER — ACETAMINOPHEN 650 MG RE SUPP: 650.0000 mg | Freq: Four times a day (QID) | RECTAL | Status: DC | PRN

## 2015-12-24 MED ORDER — FENTANYL CITRATE (PF) 100 MCG/2ML IJ SOLN
25.0000 ug | INTRAMUSCULAR | Status: DC | PRN
  Administered 2015-12-25: 25 ug via INTRAVENOUS
  Filled 2015-12-24 (×2): qty 2

## 2015-12-24 MED ORDER — ONDANSETRON HCL 4 MG/2ML IJ SOLN
4.0000 mg | Freq: Once | INTRAMUSCULAR | Status: AC
  Administered 2015-12-24: 4 mg via INTRAVENOUS
  Filled 2015-12-24: qty 2

## 2015-12-24 MED ORDER — CIPROFLOXACIN IN D5W 400 MG/200ML IV SOLN
400.0000 mg | Freq: Two times a day (BID) | INTRAVENOUS | Status: DC
  Administered 2015-12-24 – 2015-12-25 (×2): 400 mg via INTRAVENOUS
  Filled 2015-12-24 (×3): qty 200

## 2015-12-24 MED ORDER — OXYCODONE-ACETAMINOPHEN 5-325 MG PO TABS: 1.0000 | ORAL_TABLET | Freq: Four times a day (QID) | ORAL | 0 refills | Status: DC | PRN

## 2015-12-24 MED ORDER — HYDROMORPHONE HCL 2 MG/ML IJ SOLN
INTRAMUSCULAR | Status: AC
  Filled 2015-12-24: qty 1

## 2015-12-24 MED ORDER — HYDROMORPHONE HCL 1 MG/ML IJ SOLN
1.0000 mg | Freq: Once | INTRAMUSCULAR | Status: AC
  Administered 2015-12-24: 1 mg via INTRAVENOUS

## 2015-12-24 NOTE — ED Notes (Signed)
[facility] was going to be directly admitted by Dr. Earlene Plateravis and hospitialist for surgery in the am.

## 2015-12-24 NOTE — ED Notes (Signed)
Pt is being direct admitted.

## 2015-12-24 NOTE — ED Notes (Signed)
Dr. Earlene Plateravis and Dr. Joni FearsLui spoke with patient in family waiting at this time. PT to be directly admitted per Dr. Earlene Plateravis.

## 2015-12-24 NOTE — ED Notes (Addendum)
Verified with Suezanne CheshireLeslie, Charge nurse and Almira CoasterGina, [facility] that pt is going to be directly admitted, encounter number given to pt placement and Dr. Earlene Plateravis verbalized that he has spoken to hospitalist, Dr. Sherrie MustacheFisher about admitting pt to floor.  Almira CoasterGina will take pt upstairs to 300 waiting room to wait for a bed.

## 2015-12-24 NOTE — Discharge Instructions (Signed)
You are likely having her symptoms related to your gallstones. Your lab work is reassuring. You need to return later today for an ultrasound. Your pain medication will be changed to Percocet. Do not take Vicodin and Percocet together as they both have Tylenol. If you have any new or worsening symptoms you should be reevaluated immediately.

## 2015-12-24 NOTE — ED Notes (Signed)
Pt now waiting in waiting room waiting for admission.

## 2015-12-24 NOTE — ED Triage Notes (Signed)
Pt states he was seen here a week ago for abdominal pain and was told it was his gall bladder, pt states that pain in worse today

## 2015-12-24 NOTE — ED Provider Notes (Signed)
[facility] Provider Note   CSN: [id number] Arrival date & time: 12/23/15  2357  By signing my name below, I, Miguel Robbins, attest that this documentation has been prepared under the direction and in the presence of Miguel Batonourtney F Adaley Kiene, MD . Electronically Signed: Modena JanskyAlbert Robbins, Scribe. 12/24/2015. 12:50 AM.  History   Chief Complaint Chief Complaint  Patient presents with  . Abdominal Pain   The history is provided by the patient and medical records. No language interpreter was used.   HPI Comments: Miguel Robbins is a 35 y.o. male who presents to the Emergency Department complaining of intermittent moderate upper abdominal pain that started about a week ago. He states that the pain is worsening since his recent ED visit. Per medical record, pt was seen here in the ED on 12/18/15 for similar complaint due to gall stones. He describes the pain as a 10/10 radiating to his mid back. He tried taking OTC pain medication without any relief. He reports having one episode of vomiting. He denies any fever, diarrhea, dysuria, hematuria, or other complaints.  Past Medical History:  Diagnosis Date  . Arm fracture, left   . Hernia   . Migraine     There are no active problems to display for this patient.   Past Surgical History:  Procedure Laterality Date  . HERNIA REPAIR    . HERNIA REPAIR    . left arm fracture         Home Medications    Prior to Admission medications   Medication Sig Start Date End Date Taking? Authorizing Provider  HYDROcodone-acetaminophen (NORCO/VICODIN) 5-325 MG tablet Take 1-2 tablets by mouth every 6 (six) hours as needed. 12/18/15  Yes Miguel N Ward, DO  ondansetron (ZOFRAN ODT) 4 MG disintegrating tablet Take 1 tablet (4 mg total) by mouth every 8 (eight) hours as needed for nausea or vomiting. 12/24/15   Miguel Batonourtney F Miguel Lacivita, MD  oxyCODONE-acetaminophen (PERCOCET/ROXICET) 5-325 MG tablet Take 1 tablet by mouth every 6 (six) hours as needed for  severe pain. 12/24/15   Miguel Batonourtney F Miguel Karel, MD    Family History Family History  Problem Relation Age of Onset  . Hypertension Father   . Diabetes Father   . Hypertension Sister   . Diabetes Sister   . Hypertension Mother   . Hypertension Brother     Social History Social History  Substance Use Topics  . Smoking status: Current Every Day Smoker    Packs/day: 0.50    Years: 15.00    Types: Cigarettes  . Smokeless tobacco: Never Used  . Alcohol use No     Allergies   Penicillins; Bee venom; Clindamycin/lincomycin; and Cranberry   Review of Systems Review of Systems  Constitutional: Negative for fever.  Respiratory: Negative for shortness of breath.   Cardiovascular: Negative for chest pain.  Gastrointestinal: Positive for abdominal pain (Upper) and vomiting. Negative for diarrhea.  Genitourinary: Negative for dysuria and hematuria.  All other systems reviewed and are negative.    Physical Exam Updated Vital Signs BP 151/98 (BP Location: Left Arm)   Pulse (!) 56   Temp 97.4 F (36.3 C) (Oral)   Resp 16   Ht 5\' 7"  (1.702 m)   Wt 210 lb (95.3 kg)   SpO2 100%   BMI 32.89 kg/m   Physical Exam  Constitutional: He is oriented to person, place, and time. He appears well-developed and well-nourished. No distress.  HENT:  Head: Normocephalic and atraumatic.  Cardiovascular: Normal rate, regular  rhythm and normal heart sounds.   No murmur heard. Pulmonary/Chest: Effort normal and breath sounds normal. No respiratory distress. He has no wheezes.  Abdominal: Soft. Bowel sounds are normal. There is tenderness. There is no rebound and no guarding.  Epigastric and right upper quadrant tenderness to palpation without rebound or guarding  Musculoskeletal: He exhibits no edema.  Neurological: He is alert and oriented to person, place, and time.  Skin: Skin is warm and dry.  Psychiatric: He has a normal mood and affect.  Nursing note and vitals reviewed.    ED  Treatments / Results  DIAGNOSTIC STUDIES: Oxygen Saturation is 100% on RA, normal by my interpretation.    COORDINATION OF CARE: 12:54 AM- Pt advised of plan for treatment and pt agrees.  Labs (all labs ordered are listed, but only abnormal results are displayed) Labs Reviewed  COMPREHENSIVE METABOLIC PANEL - Abnormal; Notable for the following:       Result Value   Glucose, Bld 115 (*)    ALT 14 (*)    All other components within normal limits  CBC WITH DIFFERENTIAL/PLATELET  LIPASE, BLOOD    EKG  EKG Interpretation None       Radiology No results found.  Procedures Procedures (including critical care time)  Medications Ordered in ED Medications  HYDROmorphone (DILAUDID) 2 MG/ML injection (not administered)  morphine 4 MG/ML injection 4 mg (4 mg Intravenous Given 12/24/15 0128)  ondansetron (ZOFRAN) injection 4 mg (4 mg Intravenous Given 12/24/15 0127)  HYDROmorphone (DILAUDID) injection 1 mg (1 mg Intravenous Given by Other 12/24/15 1610)     Initial Impression / Assessment and Plan / ED Course  I have reviewed the triage vital signs and the nursing notes.  Pertinent labs & imaging results that were available during my care of the patient were reviewed by me and considered in my medical decision making (see chart for details).  Clinical Course as of Dec 24 407  Tue Dec 24, 2015  9604 Patient reports mild improvement but continued pain. Discussed with patient continued pain control. He may need admission for pain control repeat ultrasound in the morning. At this time, patient would like to avoid admission if possible.  [CH]  0321 Patient reports improvement of pain. Discussed options including observation admission for pain control and ultrasound in the morning versus discharge with by mouth pain medication and outpatient ultrasound with surgery follow-up. Patient does not wish to be admitted this time.  Will PO challenge.  [CH]    Clinical Course User Index [CH]  Miguel Baton, MD    Patient presents with persistent epigastric and right upper quadrant pain. Worsening tonight. Recent ultrasound revealed gallstones without evidence of cholecystitis. Symptoms consistent with biliary colic. Patient was subsequently seen on the 15th with persistent symptoms. At that time lab work was reassuring. Symptom control and follow-up with surgery was recommended. He presents today. No signs of peritonitis. Lab work is largely reassuring. I discussed with patient at length options including admission for pain control and surgery evaluation given persistent biliary colic. Patient is very reluctant for admission. At this time I have low suspicion for cholecystitis given reassuring labs and patient's clinical appearance. Do think he would benefit from repeat ultrasound at this time. Do not have ultrasound tonight. Patient is able to orally hydrate. Will increase pain medication to Percocet. Will return for ultrasound later today.  After history, exam, and medical workup I feel the patient has been appropriately medically screened and is safe for  discharge home. Pertinent diagnoses were discussed with the patient. Patient was given return precautions.   Final Clinical Impressions(s) / ED Diagnoses   Final diagnoses:  Biliary colic    New Prescriptions New Prescriptions   ONDANSETRON (ZOFRAN ODT) 4 MG DISINTEGRATING TABLET    Take 1 tablet (4 mg total) by mouth every 8 (eight) hours as needed for nausea or vomiting.   OXYCODONE-ACETAMINOPHEN (PERCOCET/ROXICET) 5-325 MG TABLET    Take 1 tablet by mouth every 6 (six) hours as needed for severe pain.   I personally performed the services described in this documentation, which was scribed in my presence. The recorded information has been reviewed and is accurate.    Miguel Batonourtney F Seleta Hovland, MD 12/24/15 [phone]

## 2015-12-24 NOTE — ED Notes (Addendum)
Pt notified me that he was going to be directly admitted.  Dr. Earlene Plateravis (surgeon) stated that he is not an emergency pt at this time and will be directly admitted to the floor.

## 2015-12-24 NOTE — H&P (Signed)
SURGICAL HISTORY & PHYSICAL (cpt [id number])  HISTORY OF PRESENT ILLNESS (HPI):  35 y.o. male presented with acute onset of RUQ > epigastric abdominal pain after [facility] [employer]. He reports he's previously experienced multiple similar episodes, for which he's presented to [facility] and been advised avoid fatty foods and schedule a surgical evaluation. However, he's been unable to do so, because he lacks health insurance, but makes too much money to qualify for financial assistance. After electing health insurance through his employer, his health insurance becomes active December 1st of this year. Despite an ultrasound demonstrating acute cholecystitis, patient has been trying to avoid medical expenses such as surgery until his insurance becomes active. He now, however, says he doesn't trust himself to avoid eating meats, cheeses, fried foods, or dairy over Thanksgiving, and he worries he may harm himself/make himself sick if/when he eats such foods, and wants to proceed with indicated cholecystectomy as soon as possible even if it means he will have to pay the costs himself. Patient continues to pass flatus and BM, denies N/V, fever/chills, CP, or SOB.  PAST MEDICAL HISTORY (PMH):  Past Medical History:  Diagnosis Date  . Arm fracture, left   . Hernia   . Migraine     Reviewed. Otherwise negative.   PAST SURGICAL HISTORY (PSH):  Past Surgical History:  Procedure Laterality Date  . HERNIA REPAIR    . HERNIA REPAIR    . left arm fracture      Reviewed. Otherwise negative.   MEDICATIONS:  Prior to Admission medications   Medication Sig Start Date End Date Taking? Authorizing Provider  HYDROcodone-acetaminophen (NORCO/VICODIN) 5-325 MG tablet Take 1-2 tablets by mouth every 6 (six) hours as needed. 12/18/15   Kristen N Ward, DO  ondansetron (ZOFRAN ODT) 4 MG disintegrating tablet Take 1 tablet (4 mg total) by mouth every 8 (eight) hours as needed for nausea or  vomiting. 12/24/15   Shon Batonourtney F Horton, MD  oxyCODONE-acetaminophen (PERCOCET/ROXICET) 5-325 MG tablet Take 1 tablet by mouth every 6 (six) hours as needed for severe pain. 12/24/15   Shon Batonourtney F Horton, MD     ALLERGIES:  Allergies  Allergen Reactions  . Penicillins Anaphylaxis    Swelling of throat  . Bee Venom Swelling  . Clindamycin/Lincomycin Itching  . Cranberry Itching     SOCIAL HISTORY:  Social History   Social History  . Marital status: Single    Spouse name: N/A  . Number of children: N/A  . Years of education: N/A   Occupational History  .  [employer]   Social History Main Topics  . Smoking status: Current Every Day Smoker    Packs/day: 0.50    Years: 15.00    Types: Cigarettes  . Smokeless tobacco: Never Used  . Alcohol use No  . Drug use: No  . Sexual activity: Yes    Birth control/ protection: None   Other Topics Concern  . Not on file   Social History Narrative  . No narrative on file    The patient currently resides (home / rehab facility / nursing home): Home  The patient normally is (ambulatory / bedbound) : Ambulatory   FAMILY HISTORY:  Family History  Problem Relation Age of Onset  . Hypertension Father   . Diabetes Father   . Hypertension Sister   . Diabetes Sister   . Hypertension Mother   . Hypertension Brother     Otherwise negative.   REVIEW  OF SYSTEMS:  Constitutional: denies any other weight loss, fever, chills, or sweats  Eyes: denies any other vision changes, history of eye injury  ENT: denies sore throat, hearing problems  Respiratory: denies shortness of breath, wheezing  Cardiovascular: denies chest pain, palpitations  Gastrointestinal: abdominal pain, N/V, and bowel function as per HPI  Genitourinary: denies burning with urination or urinary frequency Musculoskeletal: denies any other joint pains or cramps  Skin: Denies any other rashes or skin discolorations  Neurological: denies any other headache, dizziness,  weakness  Psychiatric: denies any other depression, anxiety   All other review of systems were otherwise negative.  VITAL SIGNS:  Temp:  [97.4 F (36.3 C)-98.1 F (36.7 C)] 98 F (36.7 C) (11/21 1843) Pulse Rate:  [51-59] 58 (11/21 1843) Resp:  [16-18] 16 (11/21 1843) BP: (99-151)/(64-98) 127/83 (11/21 1843) SpO2:  [95 %-100 %] 100 % (11/21 1843) Weight:  [91.2 kg (201 lb 1.6 oz)-95.3 kg (210 lb)] 91.2 kg (201 lb 1.6 oz) (11/21 1857)     Height: 5\' 7"  (170.2 cm) Weight: 91.2 kg (201 lb 1.6 oz)     INTAKE/OUTPUT:  This shift: No intake/output data recorded.  Last 2 shifts: @IOLAST2SHIFTS @  PHYSICAL EXAM:  Constitutional:  -- Normal body habitus  -- Awake, alert, and oriented x3  Eyes:  -- Pupils equally round and reactive to light  -- No scleral icterus  Ear, nose, throat:  -- No jugular venous distension  Pulmonary:  -- No crackles  -- Equal breath sounds bilaterally -- Breathing non-labored at rest Cardiovascular:  -- S1, S2 present  -- No pericardial rubs  Gastrointestinal:  -- Abdomen soft, moderate RUQ > epigastric tenderness to palpation, nondistended, no guarding/rebound  -- No abdominal masses appreciated, pulsatile or otherwise  Musculoskeletal and Integumentary:  -- Wounds or skin discoloration: None -- Extremities: B/L UE and LE FROM, hands and feet warm, no edema  Neurologic:  -- Motor function: Intact and symmetric -- Sensation: Intact and symmetric   Labs:  CBC Latest Ref Rng & Units 12/24/2015 12/18/2015 12/15/2015  WBC 4.0 - 10.5 K/uL 6.6 7.5 6.8  Hemoglobin 13.0 - 17.0 g/dL 95.6 21.3 08.6  Hematocrit 39.0 - 52.0 % 41.6 43.9 41.0  Platelets 150 - 400 K/uL 294 315 309   CMP Latest Ref Rng & Units 12/24/2015 12/18/2015 12/15/2015  Glucose 65 - 99 mg/dL 578(I) 696(E) 952(W)  BUN 6 - 20 mg/dL 8 15 10   Creatinine 0.61 - 1.24 mg/dL 4.13 2.44 0.10  Sodium 135 - 145 mmol/L 140 138 139  Potassium 3.5 - 5.1 mmol/L 3.7 3.4(L) 3.6  Chloride 101 - 111  mmol/L 107 106 108  CO2 22 - 32 mmol/L 26 26 25   Calcium 8.9 - 10.3 mg/dL 9.6 9.4 9.3  Total Protein 6.5 - 8.1 g/dL 7.6 7.8 7.6  Total Bilirubin 0.3 - 1.2 mg/dL 0.4 0.7 0.5  Alkaline Phos 38 - 126 U/L 41 42 45  AST 15 - 41 U/L 16 15 14(L)  ALT 17 - 63 U/L 14(L) 15(L) 15(L)    Imaging studies:  Abdominal Ultrasound - Limited (12/24/2015) Markedly thickened gallbladder wall up to 7 mm thick with striations/edema in wall. Increased gallbladder wall thickening since the previous exam. Scattered debris within gallbladder lumen. Shadowing calculus at lower gallbladder segment/gallbladder neck, non mobile, 15 mm diameter. Minimal pericholecystic fluid. No definite echogenic foci within gallbladder wall to suggest gas. Single tiny focus of ring-down artifact at gallbladder wall image 9. Sonographic Murphy sign present. Findings consistent with  acute Cholecystitis. Common bile duct diameter measures 5 mm (normal).  Assessment/Plan: (ICD-10's: K80.00) 35 y.o. male with acute cholecystitis.   - clear liquids diet   - NPO after midnight, IVF   - all risks, benefits, and alternatives to cholecystectomy were discussed with the patient and his family, who elect(s) to proceed, all of their questions were answered to their expressed satisfaction, and informed consent was accordingly obtained  - will plan for laparoscopic cholecystectomy 11/22 at 7:30 am  - ciprofloxacin for acute cholecystitis, PCN allergy  - DVT prophylaxis  All of the above findings and recommendations were discussed with the patient and his family, and all of his and family's questions were answered to their expressed satisfaction.  -- Scherrie GerlachJason E. Earlene Plateravis, MD, RPVI [place]: [facility] Office: [phone]

## 2015-12-24 NOTE — ED Triage Notes (Addendum)
Pt having ruq pain x1 month, US today resulted for acute cholecystitis. Pt has had emesis x2 and diarrhea x1.

## 2015-12-25 ENCOUNTER — Encounter (HOSPITAL_COMMUNITY)
Admission: AD | Disposition: A | Discharge status: Home / Self Care | Payer: Self-pay | Source: Ambulatory Visit | Attending: Surgery

## 2015-12-25 ENCOUNTER — Observation Stay (HOSPITAL_COMMUNITY): Payer: Self-pay | Admitting: Anesthesiology

## 2015-12-25 ENCOUNTER — Encounter (HOSPITAL_COMMUNITY): Payer: Self-pay | Admitting: *Deleted

## 2015-12-25 HISTORY — PX: CHOLECYSTECTOMY: SHX55

## 2015-12-25 LAB — SURGICAL PCR SCREEN
MRSA, PCR: NEGATIVE
Staphylococcus aureus: NEGATIVE

## 2015-12-25 SURGERY — LAPAROSCOPIC CHOLECYSTECTOMY
Anesthesia: General | Site: Abdomen

## 2015-12-25 MED ORDER — OXYCODONE-ACETAMINOPHEN 5-325 MG PO TABS: 1.0000 | ORAL_TABLET | ORAL | 0 refills | Status: DC | PRN

## 2015-12-25 MED ORDER — ROCURONIUM 10MG/ML (10ML) SYRINGE FOR MEDFUSION PUMP - OPTIME
INTRAVENOUS | Status: DC | PRN
  Administered 2015-12-25: 40 mg via INTRAVENOUS

## 2015-12-25 MED ORDER — GLYCOPYRROLATE 0.2 MG/ML IJ SOLN
INTRAMUSCULAR | Status: AC
  Filled 2015-12-25: qty 3

## 2015-12-25 MED ORDER — MIDAZOLAM HCL 2 MG/2ML IJ SOLN
1.0000 mg | INTRAMUSCULAR | Status: DC | PRN
  Administered 2015-12-25: 2 mg via INTRAVENOUS

## 2015-12-25 MED ORDER — LIDOCAINE HCL (CARDIAC) 10 MG/ML IV SOLN
INTRAVENOUS | Status: DC | PRN
  Administered 2015-12-25: 40 mg via INTRAVENOUS

## 2015-12-25 MED ORDER — OXYCODONE-ACETAMINOPHEN 5-325 MG PO TABS
1.0000 | ORAL_TABLET | ORAL | Status: DC | PRN
  Administered 2015-12-25 (×2): 1 via ORAL
  Filled 2015-12-25 (×2): qty 1

## 2015-12-25 MED ORDER — GLYCOPYRROLATE 0.2 MG/ML IJ SOLN
INTRAMUSCULAR | Status: DC | PRN
  Administered 2015-12-25: .7 mg via INTRAVENOUS

## 2015-12-25 MED ORDER — BUPIVACAINE HCL (PF) 0.5 % IJ SOLN
INTRAMUSCULAR | Status: AC
  Filled 2015-12-25: qty 30

## 2015-12-25 MED ORDER — NEOSTIGMINE METHYLSULFATE 10 MG/10ML IV SOLN
INTRAVENOUS | Status: AC
  Filled 2015-12-25: qty 1

## 2015-12-25 MED ORDER — ONDANSETRON HCL 4 MG/2ML IJ SOLN
INTRAMUSCULAR | Status: AC
  Filled 2015-12-25: qty 2

## 2015-12-25 MED ORDER — ONDANSETRON HCL 4 MG/2ML IJ SOLN
4.0000 mg | Freq: Once | INTRAMUSCULAR | Status: AC
  Administered 2015-12-25: 4 mg via INTRAVENOUS

## 2015-12-25 MED ORDER — FENTANYL CITRATE (PF) 100 MCG/2ML IJ SOLN
25.0000 ug | INTRAMUSCULAR | Status: DC | PRN
  Administered 2015-12-25 (×2): 50 ug via INTRAVENOUS

## 2015-12-25 MED ORDER — LIDOCAINE HCL 1 % IJ SOLN
INTRAMUSCULAR | Status: DC | PRN
  Administered 2015-12-25: 18 mL

## 2015-12-25 MED ORDER — SUCCINYLCHOLINE CHLORIDE 20 MG/ML IJ SOLN
INTRAMUSCULAR | Status: AC
  Filled 2015-12-25: qty 1

## 2015-12-25 MED ORDER — CIPROFLOXACIN IN D5W 400 MG/200ML IV SOLN: 400.0000 mg | Freq: Two times a day (BID) | INTRAVENOUS | Status: DC

## 2015-12-25 MED ORDER — NEOSTIGMINE METHYLSULFATE 10 MG/10ML IV SOLN
INTRAVENOUS | Status: DC | PRN
  Administered 2015-12-25: 4 mg via INTRAVENOUS

## 2015-12-25 MED ORDER — LIDOCAINE HCL (PF) 1 % IJ SOLN
INTRAMUSCULAR | Status: AC
  Filled 2015-12-25: qty 30

## 2015-12-25 MED ORDER — FENTANYL CITRATE (PF) 100 MCG/2ML IJ SOLN
INTRAMUSCULAR | Status: DC | PRN
  Administered 2015-12-25 (×5): 50 ug via INTRAVENOUS

## 2015-12-25 MED ORDER — LIDOCAINE HCL (PF) 1 % IJ SOLN
INTRAMUSCULAR | Status: AC
  Filled 2015-12-25: qty 10

## 2015-12-25 MED ORDER — PROPOFOL 10 MG/ML IV BOLUS
INTRAVENOUS | Status: DC | PRN
  Administered 2015-12-25: 180 mg via INTRAVENOUS

## 2015-12-25 MED ORDER — SODIUM CHLORIDE 0.9 % IR SOLN
Status: DC | PRN
  Administered 2015-12-25: 1000 mL

## 2015-12-25 MED ORDER — MIDAZOLAM HCL 2 MG/2ML IJ SOLN
INTRAMUSCULAR | Status: AC
  Filled 2015-12-25: qty 2

## 2015-12-25 MED ORDER — LACTATED RINGERS IV SOLN
INTRAVENOUS | Status: DC
  Administered 2015-12-25: 1000 mL via INTRAVENOUS

## 2015-12-25 MED ORDER — FENTANYL CITRATE (PF) 250 MCG/5ML IJ SOLN
INTRAMUSCULAR | Status: AC
  Filled 2015-12-25: qty 5

## 2015-12-25 MED ORDER — ROCURONIUM BROMIDE 50 MG/5ML IV SOLN
INTRAVENOUS | Status: AC
  Filled 2015-12-25: qty 1

## 2015-12-25 MED ORDER — HEMOSTATIC AGENTS (NO CHARGE) OPTIME
TOPICAL | Status: DC | PRN
  Administered 2015-12-25: 1 via TOPICAL

## 2015-12-25 SURGICAL SUPPLY — 52 items
ADH SKN CLS APL DERMABOND .7 (GAUZE/BANDAGES/DRESSINGS) ×1
APPLIER CLIP LAPSCP 10X32 DD (CLIP) ×3 IMPLANT
BAG HAMPER (MISCELLANEOUS) ×3 IMPLANT
BAG SPEC RTRVL LRG 6X4 10 (ENDOMECHANICALS) ×1
CHLORAPREP W/TINT 26ML (MISCELLANEOUS) ×3 IMPLANT
CLOTH BEACON ORANGE TIMEOUT ST (SAFETY) ×3 IMPLANT
COVER LIGHT HANDLE STERIS (MISCELLANEOUS) ×6 IMPLANT
DECANTER SPIKE VIAL GLASS SM (MISCELLANEOUS) ×6 IMPLANT
DERMABOND ADVANCED (GAUZE/BANDAGES/DRESSINGS) ×2
DERMABOND ADVANCED .7 DNX12 (GAUZE/BANDAGES/DRESSINGS) ×1 IMPLANT
DEVICE TROCAR PUNCTURE CLOSURE (ENDOMECHANICALS) ×3 IMPLANT
ELECT REM PT RETURN 9FT ADLT (ELECTROSURGICAL) ×3
ELECTRODE REM PT RTRN 9FT ADLT (ELECTROSURGICAL) ×1 IMPLANT
FILTER SMOKE EVAC LAPAROSHD (FILTER) ×3 IMPLANT
FORMALIN 10 PREFIL 120ML (MISCELLANEOUS) ×3 IMPLANT
GLOVE BIOGEL PI IND STRL 6.5 (GLOVE) IMPLANT
GLOVE BIOGEL PI IND STRL 7.0 (GLOVE) ×1 IMPLANT
GLOVE BIOGEL PI IND STRL 7.5 (GLOVE) ×1 IMPLANT
GLOVE BIOGEL PI INDICATOR 6.5 (GLOVE) ×2
GLOVE BIOGEL PI INDICATOR 7.0 (GLOVE) ×4
GLOVE BIOGEL PI INDICATOR 7.5 (GLOVE) ×2
GLOVE ECLIPSE 6.5 STRL STRAW (GLOVE) ×2 IMPLANT
GLOVE ECLIPSE 7.0 STRL STRAW (GLOVE) ×3 IMPLANT
GLOVE EXAM NITRILE MD LF STRL (GLOVE) ×2 IMPLANT
GLOVE SURG SS PI 6.5 STRL IVOR (GLOVE) ×2 IMPLANT
GOWN STRL REUS W/ TWL XL LVL3 (GOWN DISPOSABLE) ×1 IMPLANT
GOWN STRL REUS W/TWL LRG LVL3 (GOWN DISPOSABLE) ×6 IMPLANT
GOWN STRL REUS W/TWL XL LVL3 (GOWN DISPOSABLE) ×3
HEMOSTAT SNOW SURGICEL 2X4 (HEMOSTASIS) ×3 IMPLANT
INST SET LAPROSCOPIC AP (KITS) ×3 IMPLANT
IV NS IRRIG 3000ML ARTHROMATIC (IV SOLUTION) IMPLANT
KIT ROOM TURNOVER APOR (KITS) ×3 IMPLANT
MANIFOLD NEPTUNE II (INSTRUMENTS) ×3 IMPLANT
NDL INSUFFLATION 14GA 120MM (NEEDLE) ×1 IMPLANT
NEEDLE INSUFFLATION 14GA 120MM (NEEDLE) ×3 IMPLANT
NS IRRIG 1000ML POUR BTL (IV SOLUTION) ×3 IMPLANT
PACK LAP CHOLE LZT030E (CUSTOM PROCEDURE TRAY) ×3 IMPLANT
PAD ARMBOARD 7.5X6 YLW CONV (MISCELLANEOUS) ×3 IMPLANT
POUCH SPECIMEN RETRIEVAL 10MM (ENDOMECHANICALS) ×3 IMPLANT
SET BASIN LINEN APH (SET/KITS/TRAYS/PACK) ×3 IMPLANT
SET TUBE IRRIG SUCTION NO TIP (IRRIGATION / IRRIGATOR) IMPLANT
SLEEVE ENDOPATH XCEL 5M (ENDOMECHANICALS) ×6 IMPLANT
SUT MNCRL AB 4-0 PS2 18 (SUTURE) ×2 IMPLANT
SUT VIC AB 4-0 PS2 27 (SUTURE) ×3 IMPLANT
SUT VICRYL 0 UR6 27IN ABS (SUTURE) ×3 IMPLANT
SUT VICRYL AB 3-0 FS1 BRD 27IN (SUTURE) ×3 IMPLANT
TROCAR ENDO BLADELESS 11MM (ENDOMECHANICALS) ×3 IMPLANT
TROCAR XCEL NON-BLD 5MMX100MML (ENDOMECHANICALS) ×3 IMPLANT
TUBE CONNECTING 12'X1/4 (SUCTIONS) ×1
TUBE CONNECTING 12X1/4 (SUCTIONS) ×1 IMPLANT
TUBING INSUFFLATION (TUBING) ×3 IMPLANT
WARMER LAPAROSCOPE (MISCELLANEOUS) ×3 IMPLANT

## 2015-12-25 NOTE — Discharge Instructions (Signed)
In addition to included general post-operative instructions for Laparoscopic Cholecystectomy,  Diet: Resume home heart healthy diet.   Activity: No heavy lifting (children, pets, laundry) or strenuous activity until follow-up, but light activity and walking are encouraged. Do not drive or drink alcohol if taking narcotic pain medications.   Wound care: 2 days after surgery (Friday, 11/24), may shower/get incision wet with soapy water and pat dry (do not rub incisions), but no baths or submerging incision underwater until follow-up.   Medications: Resume all home medications. For mild to moderate pain: acetaminophen (Tylenol) or ibuprofen (if no kidney disease). Narcotic pain medications, if prescribed, can be used for severe pain, though may cause nausea, constipation, and drowsiness. Do not combine Tylenol and Percocet within a 6 hour period as Percocet contains Tylenol. If you do not need the narcotic pain medication, you do not need to fill the prescription.  Call office [phone]) at any time if any questions, worsening pain, fevers/chills, bleeding, drainage from incision site, or other concerns.

## 2015-12-25 NOTE — Op Note (Signed)
SURGICAL OPERATIVE REPORT   DATE OF PROCEDURE: 12/25/2015  ATTENDING Surgeon(s): Ancil LinseyJason Evan Davis, MD  ANESTHESIA: GETA  PRE-OPERATIVE DIAGNOSIS: Acute Cholecystitis (K80.00)  POST-OPERATIVE DIAGNOSIS: Acute Cholecystitis (K80.00)  PROCEDURE(S): (cpt's: 47562) 1.) Laparoscopic Cholecystectomy  INTRAOPERATIVE FINDINGS: Moderate peri-cholecystic inflammation and adhesions  INTRAOPERATIVE FLUIDS: 1000 mL crystalloid   ESTIMATED BLOOD LOSS: Minimal (<30 mL)   URINE OUTPUT: No foley  SPECIMENS: Gallbladder  IMPLANTS: None  DRAINS: None   COMPLICATIONS: None apparent   CONDITION AT COMPLETION: Hemodynamically stable and extubated  DISPOSITION: PACU   INDICATION(S) FOR PROCEDURE:  Patient is a 35 y.o. male who presented with acute onset of RUQ > epigastric abdominal pain after he [facility] [employer] where [facility]. He reported he's previously experienced multiple similar episodes, for which he's presented to [facility] and been advised avoid fatty foods and schedule a surgical evaluation. However, he says he was unable to do so, because he lacked health insurance, but makes too much money to qualify for financial assistance. After electing health insurance through his employer, his health insurance becomes active December 1st of this year. Despite an ultrasound 11/12 demonstrating acute cholecystitis, patient was been trying to avoid medical expenses such as surgery until his insurance becomes active. However, this episode he saidhe doesn't trust himself to avoid eating meats, cheeses, fried foods, or dairy over Thanksgiving, and he worries he may harm himself if/when he eats such foods, and elected to proceed with indicated cholecystectomy as soon as possible even if it means he will have to pay the costs himself. All risks, benefits, and alternatives to above elective procedures were discussed with the patient, who elected to proceed, and informed consent was accordingly  obtained at that time.   DETAILS OF PROCEDURE:  Patient was brought to the operating suite and appropriately identified. General anesthesia was administered along with peri-operative prophylactic IV antibiotics, and endotracheal intubation was performed by anesthesiologist, along with NG/OG tube for gastric decompression. In supine position, operative site was prepped and draped in usual sterile fashion, and following a brief time out, initial 5 mm incision was made in a natural skin crease just above the umbilicus. Fascia was then elevated, and a Verress needle was inserted and its proper position confirmed using aspiration and saline meniscus test.  Upon insufflation of the abdominal cavity with carbon dioxide to a well-tolerated pressure of 12-15 mmHg, 5 mm peri-umbilical port followed by laparoscope were inserted and used to inspect the abdominal cavity and its contents with no injuries from insertion of the first trochar noted. Three additional trocars were inserted, one at the epigastric position (10 mm) and two along the Right costal margin (5 mm). The table was then placed in reverse Trendelenburg position with the Right side up. Filmy adhesions between the gallbladder and omentum/duodenum/transverse colon were lysed using combined blunt and sharp dissection. The apex/dome of the gallbladder was grasped with an atraumatic grasper passed through the lateral port and retracted apically over the liver. The infundibulum was also grasped and retracted, exposing Calot's triangle. The peritoneum overlying the gallbladder infundibulum was incised and dissected free of surrounding peritoneal attachments, revealing the cystic duct and cystic artery, which were clipped twice on the patient side and once on the gallbladder specimen side close to the gallbladder. The gallbladder was then dissected from its peritoneal attachments to the liver using electrocautery, and the gallbladder was placed into a laparoscopic  specimen bag and removed from the abdominal cavity via the epigastric port site. Hemostasis  and secure placement of clips were confirmed, and intra-peritoneal cavity was inspected with no additional findings. Endoclose laparoscopic fascial closure device was then used to re-approximate fascia at the 10 mm epigastric port site.  All ports were then removed under direct visualization, and abdominal cavity was desuflated. All port sites were irrigated/cleaned, additional local anesthetic was injected at each incision, 3-0 Vicryl was used to re-approximate dermis at 10 mm port site(s), and subcuticular 4-0 Monocryl suture was used to re-approximate skin. Skin was then cleaned, dried, and sterile skin glue was applied. Patient was then safely able to be awakened, extubated, and transferred to PACU for post-operative monitoring and care.   I was present for all aspects of procedure, and there were no intra-operative complications apparent.

## 2015-12-25 NOTE — Anesthesia Postprocedure Evaluation (Signed)
Anesthesia Post Note  Patient: Miguel Robbins  Procedure(s) Performed: Procedure(s) (LRB): LAPAROSCOPIC CHOLECYSTECTOMY (N/A)  Patient location during evaluation: PACU Anesthesia Type: General Level of consciousness: awake Pain control: sharp pain at present, but the medicine works  Vital Signs Assessment: post-procedure vital signs reviewed and stable Respiratory status: spontaneous breathing Cardiovascular status: stable Anesthetic complications: no    Last Vitals:  Vitals:   12/25/15 0930 12/25/15 0945  BP: 132/86 (!) 141/80  Pulse: 66 65  Resp: 14 14  Temp:      Last Pain:  Vitals:   12/25/15 0945  TempSrc:   PainSc: 5                  [place]

## 2015-12-25 NOTE — Progress Notes (Signed)
Patient states understanding of discharge instructions, prescription given. 

## 2015-12-25 NOTE — Discharge Summary (Signed)
Physician Discharge Summary  Patient ID: Miguel Robbins MRN: [id number] DOB/AGE: 34/05/1980 34 y.o.  Admit date: 12/24/2015 Discharge date: 12/25/2015  Admission Diagnoses:  Discharge Diagnoses:  Principal Problem:   Calculus of gallbladder with acute cholecystitis Active Problems:   Acute calculous cholecystitis   Discharged Condition: good  Hospital Course: Patient is a 35 y.o. male who presented with acute onset of RUQ > epigastric abdominal pain after [facility]. He reported he's previously experienced multiple similar episodes, for which he's presented to [facility] and been advised avoid fatty foods and schedule a surgical evaluation. However, he says he was unable to do so, because he lacked health insurance, but makes too much money to qualify for financial assistance. After electing health insurance through his employer, his health insurance becomes active December 1st of this year. Despite an ultrasound 11/12 demonstrating acute cholecystitis, patient was been trying to avoid medical expenses such as surgery until his insurance becomes active. However, after this episode he said he doesn't trust himself to avoid eating meats, cheeses, fried foods, or dairy over Thanksgiving, he worried he may harm himself if/when he eats such foods, and he elected to proceed with indicated cholecystectomy as soon as possible even if it means he will have to pay the costs himself. All risks, benefits, and alternatives to above elective procedures were discussed with the patient, who elected to proceed, and informed consent was accordingly obtained at that time. Patient underwent uneventful laparoscopic cholecystectomy 11/22, he was able to tolerate PO and ambulate post-operatively with his pain well-controlled, and he was safely able to be discharged home with appropriate discharge instructions and follow-up.  Consults: None  Significant Diagnostic Studies:  radiology: Ultrasound: Acute cholecystitis  Treatments: surgery: Laparoscopic cholecystectomy  Discharge Exam: Blood pressure (P) 101/81, pulse 62, temperature 98.3 F (36.8 C), resp. rate (P) 14, height 5\' 7"  (1.702 m), weight 91.2 kg (201 lb 1.6 oz), SpO2 (P) 100 %. General appearance: alert, cooperative and no distress GI: soft, mild-moderate epigastric peri-incisional tenderness to palpation; incisions well-approximated without erythema or drainage; no masses; and no organomegaly  Disposition: 01-Home or Self Care     Medication List    STOP taking these medications   HYDROcodone-acetaminophen 5-325 MG tablet Commonly known as:  NORCO/VICODIN   ondansetron 4 MG disintegrating tablet Commonly known as:  ZOFRAN ODT     TAKE these medications   oxyCODONE-acetaminophen 5-325 MG tablet Commonly known as:  ROXICET Take 1 tablet by mouth every 4 (four) hours as needed for severe pain. What changed:  when to take this      Follow-up Information    Miguel LinseyJason Evan Lynell Greenhouse, MD. Schedule an appointment as soon as possible for a visit in 2 day(s).   Specialty:  General Surgery Contact information: [address] [place] [facility] [id number] [phone]           Signed: Ancil LinseyJason Evan Dirk Robbins 12/25/2015, 9:06 AM

## 2015-12-25 NOTE — Anesthesia Procedure Notes (Signed)
Procedure Name: Intubation Date/Time: 12/25/2015 7:43 AM Performed by: Glynn OctaveANIEL, [place] Rowe E Pre-anesthesia Checklist: Patient identified, Patient being monitored, Timeout performed, Emergency Drugs available and Suction available Patient Re-evaluated:Patient Re-evaluated prior to inductionOxygen Delivery Method: Circle system utilized Preoxygenation: Pre-oxygenation with 100% oxygen Intubation Type: IV induction Ventilation: Mask ventilation without difficulty Laryngoscope Size: Mac and 3 Grade View: Grade I Tube type: Oral Tube size: 7.0 mm Number of attempts: 1 Airway Equipment and Method: Stylet Placement Confirmation: ETT inserted through vocal cords under direct vision,  positive ETCO2 and breath sounds checked- equal and bilateral Secured at: 21 cm Tube secured with: Tape Dental Injury: Teeth and Oropharynx as per pre-operative assessment

## 2015-12-25 NOTE — Anesthesia Preprocedure Evaluation (Signed)
Anesthesia Evaluation  Patient identified by MRN, date of birth, ID band Patient awake    Reviewed: Allergy & Precautions, NPO status , Patient's Chart, lab work & pertinent test results  Airway Mallampati: I  TM Distance: >3 FB     Dental  (+) Poor Dentition, Chipped,    Pulmonary Current Smoker,    breath sounds clear to auscultation       Cardiovascular negative cardio ROS   Rhythm:Regular Rate:Normal     Neuro/Psych  Headaches,    GI/Hepatic neg GERD  ,  Endo/Other    Renal/GU      Musculoskeletal   Abdominal   Peds  Hematology   Anesthesia Other Findings Acute cholecystitis? - no pain or nausea.  Reproductive/Obstetrics                             Anesthesia Physical Anesthesia Plan  ASA: II  Anesthesia Plan: General   Post-op Pain Management:    Induction: Intravenous  Airway Management Planned: Oral ETT  Additional Equipment:   Intra-op Plan:   Post-operative Plan: Extubation in OR  Informed Consent: I have reviewed the patients History and Physical, chart, labs and discussed the procedure including the risks, benefits and alternatives for the proposed anesthesia with the patient or authorized representative who has indicated his/her understanding and acceptance.     Plan Discussed with:   Anesthesia Plan Comments:         Anesthesia Quick Evaluation

## 2015-12-25 NOTE — Transfer of Care (Signed)
Immediate Anesthesia Transfer of Care Note  Patient: Miguel Robbins  Procedure(s) Performed: Procedure(s): LAPAROSCOPIC CHOLECYSTECTOMY (N/A)  Patient Location: PACU  Anesthesia Type:General  Level of Consciousness: awake, alert  and oriented  Airway & Oxygen Therapy: Patient Spontanous Breathing and Patient connected to face mask oxygen  Post-op Assessment: Report given to RN  Post vital signs: Reviewed and stable  Last Vitals:  Vitals:   12/25/15 0715 12/25/15 0730  BP: 119/85 (!) 144/85  Pulse:    Resp: 18 (!) 40  Temp:      Last Pain:  Vitals:   12/25/15 0706  TempSrc: Oral      Patients Stated Pain Goal: 8 (12/25/15 0706)  Complications: No apparent anesthesia complications

## 2015-12-31 ENCOUNTER — Encounter (HOSPITAL_COMMUNITY): Payer: Self-pay | Admitting: Surgery

## 2016-01-01 ENCOUNTER — Telehealth: Payer: Self-pay | Admitting: *Deleted

## 2016-01-01 NOTE — Telephone Encounter (Signed)
Pharmacy called related to Rx: zofran orders .Marland Kitchen.Marland Kitchen.EDCM confirmed with EDP that there was a change in dosage.

## 2016-06-12 ENCOUNTER — Encounter (HOSPITAL_COMMUNITY): Payer: Self-pay | Admitting: Emergency Medicine

## 2016-06-12 ENCOUNTER — Emergency Department (HOSPITAL_COMMUNITY)
Admission: EM | Admit: 2016-06-12 | Discharge: 2016-06-12 | Disposition: A | Discharge status: Home / Self Care | Payer: Self-pay | Attending: Emergency Medicine | Admitting: Emergency Medicine

## 2016-06-12 DIAGNOSIS — Z202 Contact with and (suspected) exposure to infections with a predominantly sexual mode of transmission: Secondary | ICD-10-CM | POA: Insufficient documentation

## 2016-06-12 DIAGNOSIS — F17210 Nicotine dependence, cigarettes, uncomplicated: Secondary | ICD-10-CM | POA: Insufficient documentation

## 2016-06-12 DIAGNOSIS — Z711 Person with feared health complaint in whom no diagnosis is made: Secondary | ICD-10-CM

## 2016-06-12 HISTORY — DX: Trichomoniasis, unspecified: A59.9

## 2016-06-12 LAB — URINALYSIS, ROUTINE W REFLEX MICROSCOPIC
BACTERIA UA: NONE SEEN
BILIRUBIN URINE: NEGATIVE
Glucose, UA: NEGATIVE mg/dL
Hgb urine dipstick: NEGATIVE
Ketones, ur: NEGATIVE mg/dL
LEUKOCYTES UA: NEGATIVE
NITRITE: NEGATIVE
PH: 5 (ref 5.0–8.0)
Protein, ur: 30 mg/dL — AB
RBC / HPF: NONE SEEN RBC/hpf (ref 0–5)
SPECIFIC GRAVITY, URINE: 1.03 (ref 1.005–1.030)

## 2016-06-12 MED ORDER — AZITHROMYCIN 250 MG PO TABS
2000.0000 mg | ORAL_TABLET | Freq: Once | ORAL | Status: AC
  Administered 2016-06-12: 2000 mg via ORAL
  Filled 2016-06-12: qty 8

## 2016-06-12 NOTE — ED Provider Notes (Signed)
[facility] Provider Note   CSN: [id number] Arrival date & time: 06/12/16  1504     History   Chief Complaint Chief Complaint  Patient presents with  . Exposure to STD    HPI Miguel Robbins is a 36 y.o. male who presents with dysuria that began last night. Patient reports that he has a history of STDs (records show Trichomonas) and states that burning sensation feels similar to past STD experiences. He states that he is currently sexually active one partner but does not use protection and he believes he may have been exposed to an STD. He denies any testicular swelling, pain. He denies any fever.  The history is provided by the patient.    Past Medical History:  Diagnosis Date  . Arm fracture, left   . Hernia   . Migraine   . Trichimoniasis     Patient Active Problem List   Diagnosis Date Noted  . Calculus of gallbladder with acute cholecystitis 12/24/2015  . Acute calculous cholecystitis 12/24/2015    Past Surgical History:  Procedure Laterality Date  . CHOLECYSTECTOMY N/A 12/25/2015   Procedure: LAPAROSCOPIC CHOLECYSTECTOMY;  Surgeon: Ancil LinseyJason Evan Davis, MD;  Location: [facility];  Service: General;  Laterality: N/A;  . HERNIA REPAIR    . HERNIA REPAIR    . left arm fracture         Home Medications    Prior to Admission medications   Medication Sig Start Date End Date Taking? Authorizing Provider  oxyCODONE-acetaminophen (ROXICET) 5-325 MG tablet Take 1 tablet by mouth every 4 (four) hours as needed for severe pain. 12/25/15   Ancil Linseyavis, Jason Evan, MD    Family History Family History  Problem Relation Age of Onset  . Hypertension Father   . Diabetes Father   . Hypertension Sister   . Diabetes Sister   . Hypertension Mother   . Hypertension Brother     Social History Social History  Substance Use Topics  . Smoking status: Current Every Day Smoker    Packs/day: 0.50    Years: 15.00    Types: Cigarettes  . Smokeless tobacco: Never Used  .  Alcohol use No     Allergies   Penicillins; Bee venom; Clindamycin/lincomycin; and Cranberry   Review of Systems Review of Systems  Constitutional: Negative for fever.  Genitourinary: Positive for dysuria. Negative for discharge, hematuria and scrotal swelling.     Physical Exam Updated Vital Signs BP 116/69 (BP Location: Right Arm)   Pulse 72   Temp 98.2 F (36.8 C) (Oral)   Resp 16   Ht 5\' 7"  (1.702 m)   Wt 99.8 kg   SpO2 97%   BMI 34.46 kg/m   Physical Exam  Constitutional: He appears well-developed and well-nourished.  Sitting comfortably on bed  Abdominal: Soft. Normal appearance. There is no tenderness. Hernia confirmed negative in the right inguinal area and confirmed negative in the left inguinal area.  Genitourinary: Testes normal and penis normal. Right testis shows no swelling and no tenderness. Left testis shows no swelling and no tenderness. Circumcised.  Genitourinary Comments: The exam was performed with a chaperone present. Normal male genitalia. No penile sores or ulcers. No testicular mass, tenderness or swelling. No penile discharge. No hernia bilaterally.   Neurological: He is alert.  Vitals reviewed.    ED Treatments / Results  Labs (all labs ordered are listed, but only abnormal results are displayed) Labs Reviewed  URINALYSIS, ROUTINE W REFLEX MICROSCOPIC - Abnormal; Notable for  the following:       Result Value   Protein, ur 30 (*)    Squamous Epithelial / LPF 0-5 (*)    All other components within normal limits  RPR  HIV ANTIBODY (ROUTINE TESTING)  GC/CHLAMYDIA PROBE AMP ([place]) NOT AT [facility]    EKG  EKG Interpretation None       Radiology No results found.  Procedures Procedures (including critical care time)  Medications Ordered in ED Medications  azithromycin (ZITHROMAX) tablet 2,000 mg (2,000 mg Oral Given 06/12/16 1719)     Initial Impression / Assessment and Plan / ED Course  I have reviewed the triage vital  signs and the nursing notes.  Pertinent labs & imaging results that were available during my care of the patient were reviewed by me and considered in my medical decision making (see chart for details).     36 y.o. M who presents with dysuria that began last night. No hematuria, penile discharge, testicular pain/mass or swelling. Physical exam shows no abdominal tenderness. Normal GU exam without evidence of discharge or testicular mass or swelling. Consider UTI vs STD. GC/Chlamydia swab done at bedside. Will collect UA for evaluation of UTI.   UA reviewed. No signs of infection. Discussed results with patient. Discussed with him that since he is asymptomatic and he is unsure if he was exposed to an STD we can wait on treatment but he prefers to be treated now. Patient is allergic to PCN and therefore will not treat with Ceftriaxone. Patient given 2g of Azithromycin in the department. Strict return precautions given. Patient expresses understanding and agreement to plan.   Final Clinical Impressions(s) / ED Diagnoses   Final diagnoses:  Concern about STD in male without diagnosis    New Prescriptions Discharge Medication List as of 06/12/2016  5:14 PM       Maxwell Caul, PA-C 06/12/16 2201    Bethann Berkshire, MD 06/12/16 2301

## 2016-06-12 NOTE — Discharge Instructions (Signed)
Your results will return in the next few days. You will be contact if there is any abnormality.  Follow-up with your primary care doctor in 24-48 hours.  Return to the Emergency Dept for any fever, testicular pain, pain when you urinate, blood in your urine or any other worsening or concerning symptoms.   [facility]  [facility]     [employer]                          [facility] [address] [place]                       [address]      [address]  [place]                                                [place]                            [place] Phone:  [phone]                                   Phone:  [phone]                 Phone:  [phone]  [facility] Phone:  [phone]  [facility] Child [facility] Hotline [phone](336) [phone] (After Hours)

## 2016-06-12 NOTE — ED Triage Notes (Signed)
Believes he has a STD after noticing a clear discharge from his penis  No PCP

## 2016-06-13 LAB — HIV ANTIBODY (ROUTINE TESTING W REFLEX): HIV SCREEN 4TH GENERATION: NONREACTIVE

## 2016-06-13 LAB — RPR: RPR: NONREACTIVE

## 2016-06-15 LAB — GC/CHLAMYDIA PROBE AMP (~~LOC~~) NOT AT ARMC
Chlamydia: NEGATIVE
Neisseria Gonorrhea: NEGATIVE

## 2016-08-28 ENCOUNTER — Emergency Department (HOSPITAL_COMMUNITY): Payer: Self-pay

## 2016-08-28 ENCOUNTER — Encounter (HOSPITAL_COMMUNITY): Payer: Self-pay | Admitting: Adult Health

## 2016-08-28 ENCOUNTER — Emergency Department (HOSPITAL_COMMUNITY)
Admission: EM | Admit: 2016-08-28 | Discharge: 2016-08-28 | Disposition: A | Discharge status: Home / Self Care | Payer: Self-pay | Attending: Emergency Medicine | Admitting: Emergency Medicine

## 2016-08-28 DIAGNOSIS — R1012 Left upper quadrant pain: Secondary | ICD-10-CM | POA: Insufficient documentation

## 2016-08-28 DIAGNOSIS — R109 Unspecified abdominal pain: Secondary | ICD-10-CM

## 2016-08-28 LAB — URINALYSIS, ROUTINE W REFLEX MICROSCOPIC
BACTERIA UA: NONE SEEN
BILIRUBIN URINE: NEGATIVE
GLUCOSE, UA: NEGATIVE mg/dL
Hgb urine dipstick: NEGATIVE
KETONES UR: NEGATIVE mg/dL
LEUKOCYTES UA: NEGATIVE
NITRITE: NEGATIVE
PH: 7 (ref 5.0–8.0)
Protein, ur: 30 mg/dL — AB
SPECIFIC GRAVITY, URINE: 1.013 (ref 1.005–1.030)
Squamous Epithelial / HPF: NONE SEEN

## 2016-08-28 NOTE — ED Provider Notes (Signed)
[facility] Provider Note   CSN: [id number] Arrival date & time: 08/28/16  7829     History   Chief Complaint Chief Complaint  Patient presents with  . Hematuria    HPI Miguel Robbins is a 36 y.o. male.  HPI  Pt was seen at 0950. Per pt, c/o sudden onset and persistence of waxing and waning left sided flank "pain" that began yesterday.  Pt describes the pain as "sore" and "like I slept wrong on it." Has been associated with multiple intermittent episodes of hematuria and nausea.  Denies testicular pain/swelling, no dysuria, no abd pain, no vomiting/diarrhea, no black or blood in stools, no CP/SOB, no fevers, no rash.    Past Medical History:  Diagnosis Date  . Arm fracture, left   . Hernia   . Migraine   . Trichimoniasis     Patient Active Problem List   Diagnosis Date Noted  . Calculus of gallbladder with acute cholecystitis 12/24/2015  . Acute calculous cholecystitis 12/24/2015    Past Surgical History:  Procedure Laterality Date  . CHOLECYSTECTOMY N/A 12/25/2015   Procedure: LAPAROSCOPIC CHOLECYSTECTOMY;  Surgeon: Ancil Linsey, MD;  Location: [facility];  Service: General;  Laterality: N/A;  . HERNIA REPAIR    . HERNIA REPAIR    . left arm fracture         Home Medications    Prior to Admission medications   Medication Sig Start Date End Date Taking? Authorizing Provider  oxyCODONE-acetaminophen (ROXICET) 5-325 MG tablet Take 1 tablet by mouth every 4 (four) hours as needed for severe pain. 12/25/15   Ancil Linsey, MD    Family History Family History  Problem Relation Age of Onset  . Hypertension Father   . Diabetes Father   . Hypertension Sister   . Diabetes Sister   . Hypertension Mother   . Hypertension Brother     Social History Social History  Substance Use Topics  . Smoking status: Current Every Day Smoker    Packs/day: 0.50    Years: 15.00    Types: Cigarettes  . Smokeless tobacco: Never Used  . Alcohol use No      Allergies   Penicillins; Bee venom; Clindamycin/lincomycin; and Cranberry   Review of Systems Review of Systems ROS: Statement: All systems negative except as marked or noted in the HPI; Constitutional: Negative for fever and chills. ; ; Eyes: Negative for eye pain, redness and discharge. ; ; ENMT: Negative for ear pain, hoarseness, nasal congestion, sinus pressure and sore throat. ; ; Cardiovascular: Negative for chest pain, palpitations, diaphoresis, dyspnea and peripheral edema. ; ; Respiratory: Negative for cough, wheezing and stridor. ; ; Gastrointestinal: +nausea. Negative for vomiting, diarrhea, abdominal pain, blood in stool, hematemesis, jaundice and rectal bleeding. . ; ; Genitourinary: Negative for dysuria, +flank pain and hematuria. ; ;  Genital:  No penile drainage or rash, no testicular pain or swelling, no scrotal rash or swelling. ;; Musculoskeletal: Negative for neck pain. Negative for swelling and trauma.; ; Skin: Negative for pruritus, rash, abrasions, blisters, bruising and skin lesion.; ; Neuro: Negative for headache, lightheadedness and neck stiffness. Negative for weakness, altered level of consciousness, altered mental status, extremity weakness, paresthesias, involuntary movement, seizure and syncope.       Physical Exam Updated Vital Signs BP (!) 126/92   Pulse 63   Temp 98.2 F (36.8 C) (Oral)   Resp 18   Ht 5\' 7"  (1.702 m)   Wt 90.7 kg (200 lb)  SpO2 100%   BMI 31.32 kg/m   Physical Exam 0955: Physical examination:  Nursing notes reviewed; Vital signs and O2 SAT reviewed;  Constitutional: Well developed, Well nourished, Well hydrated, In no acute distress; Head:  Normocephalic, atraumatic; Eyes: EOMI, PERRL, No scleral icterus; ENMT: Mouth and pharynx normal, Mucous membranes moist; Neck: Supple, Full range of motion, No lymphadenopathy; Cardiovascular: Regular rate and rhythm, No gallop; Respiratory: Breath sounds clear & equal bilaterally, No wheezes.   Speaking full sentences with ease, Normal respiratory effort/excursion; Chest: Nontender, Movement normal; Abdomen: Soft, Nontender, Nondistended, Normal bowel sounds; Genitourinary: No CVA tenderness; Spine:  No midline CS, TS, LS tenderness.;; Extremities: Pulses normal, No tenderness, No edema, No calf edema or asymmetry.; Neuro: AA&Ox3, Major CN grossly intact.  Speech clear. No gross focal motor or sensory deficits in extremities. Climbs on and off stretcher easily by himself. Gait steady.; Skin: Color normal, Warm, Dry.   ED Treatments / Results  Labs (all labs ordered are listed, but only abnormal results are displayed)   EKG  EKG Interpretation None       Radiology   Procedures Procedures (including critical care time)  Medications Ordered in ED Medications - No data to display   Initial Impression / Assessment and Plan / ED Course  I have reviewed the triage vital signs and the nursing notes.  Pertinent labs & imaging results that were available during my care of the patient were reviewed by me and considered in my medical decision making (see chart for details).  MDM Reviewed: previous chart, nursing note and vitals Interpretation: labs and CT scan   Results for orders placed or performed during the hospital encounter of 08/28/16  Urinalysis, Routine w reflex microscopic- may I&O cath if menses  Result Value Ref Range   Color, Urine YELLOW YELLOW   APPearance CLEAR CLEAR   Specific Gravity, Urine 1.013 1.005 - 1.030   pH 7.0 5.0 - 8.0   Glucose, UA NEGATIVE NEGATIVE mg/dL   Hgb urine dipstick NEGATIVE NEGATIVE   Bilirubin Urine NEGATIVE NEGATIVE   Ketones, ur NEGATIVE NEGATIVE mg/dL   Protein, ur 30 (A) NEGATIVE mg/dL   Nitrite NEGATIVE NEGATIVE   Leukocytes, UA NEGATIVE NEGATIVE   RBC / HPF 0-5 0 - 5 RBC/hpf   WBC, UA 0-5 0 - 5 WBC/hpf   Bacteria, UA NONE SEEN NONE SEEN   Squamous Epithelial / LPF NONE SEEN NONE SEEN   Ct Renal Stone Study Result  Date: 08/28/2016 CLINICAL DATA:  LEFT flank pain with gross hematuria since yesterday EXAM: CT ABDOMEN AND PELVIS WITHOUT CONTRAST TECHNIQUE: Multidetector CT imaging of the abdomen and pelvis was performed following the standard protocol without IV contrast. Sagittal and coronal MPR images reconstructed from axial data set. Oral contrast was not administered for this indication. COMPARISON:  03/26/2014 FINDINGS: Lower chest: Lung bases clear Hepatobiliary: Gallbladder surgically absent. Liver normal appearance. No biliary dilatation. Pancreas: Normal appearance Spleen: Normal appearance Adrenals/Urinary Tract: Adrenal glands, kidneys, and ureters normal appearance. Bladder decompressed. No urinary tract calcification or dilatation. Stomach/Bowel: Normal appendix. Scattered stool throughout colon. Stomach and bowel loops otherwise unremarkable for exam lacking contrast and adequate distention. Vascular/Lymphatic: Minimal atherosclerotic calcification aorta without aneurysm. Few small pelvic phleboliths. No adenopathy. Reproductive: Unremarkable prostate gland and seminal vesicles Other: No free air or free fluid.  No hernia. Musculoskeletal: Unremarkable IMPRESSION: No acute intra-abdominal or intrapelvic abnormalities. Aortic Atherosclerosis (ICD10-I70.0). Electronically Signed   By: Ulyses SouthwardMark  Boles M.D.   On: 08/28/2016 10:10  1025:  Workup reassuring. UA without blood. Dx and testing d/w pt.  Questions answered.  Verb understanding, agreeable to d/c home with outpt f/u.    Final Clinical Impressions(s) / ED Diagnoses   Final diagnoses:  None    New Prescriptions New Prescriptions   No medications on file     Samuel JesterMcManus, Laneta Guerin, DO 08/31/16 2148

## 2016-08-28 NOTE — ED Notes (Signed)
PT returned from CT

## 2016-08-28 NOTE — ED Triage Notes (Signed)
Presents with hematuria that began yesterday and bilateral flank pain. Pt endorses large clots in urine. Hematuria continued this AM. Denies fevers, penile discharge and history of same.

## 2016-08-28 NOTE — Discharge Instructions (Signed)
Take over the counter tylenol and ibuprofen, as directed on packaging, as needed for discomfort.  Apply moist heat or ice to the area(s) of discomfort, for 15 minutes at a time, several times per day for the next few days.  Do not fall asleep on a heating or ice pack.  Call your regular medical doctor today to schedule a follow up appointment this week.  Return to the Emergency Department immediately if worsening. ° °

## 2016-11-24 ENCOUNTER — Encounter (HOSPITAL_COMMUNITY): Payer: Self-pay | Admitting: Emergency Medicine

## 2016-11-24 ENCOUNTER — Emergency Department (HOSPITAL_COMMUNITY)
Admission: EM | Admit: 2016-11-24 | Discharge: 2016-11-24 | Disposition: A | Discharge status: Home / Self Care | Payer: Self-pay | Attending: Emergency Medicine | Admitting: Emergency Medicine

## 2016-11-24 ENCOUNTER — Emergency Department (HOSPITAL_COMMUNITY): Payer: Self-pay

## 2016-11-24 DIAGNOSIS — Z885 Allergy status to narcotic agent status: Secondary | ICD-10-CM | POA: Insufficient documentation

## 2016-11-24 DIAGNOSIS — M25462 Effusion, left knee: Secondary | ICD-10-CM | POA: Insufficient documentation

## 2016-11-24 DIAGNOSIS — F17210 Nicotine dependence, cigarettes, uncomplicated: Secondary | ICD-10-CM | POA: Insufficient documentation

## 2016-11-24 DIAGNOSIS — Z91030 Bee allergy status: Secondary | ICD-10-CM | POA: Insufficient documentation

## 2016-11-24 MED ORDER — NAPROXEN 500 MG PO TABS: 500.0000 mg | ORAL_TABLET | Freq: Two times a day (BID) | ORAL | 0 refills | Status: DC

## 2016-11-24 MED ORDER — NAPROXEN 250 MG PO TABS
500.0000 mg | ORAL_TABLET | Freq: Once | ORAL | Status: AC
  Administered 2016-11-24: 500 mg via ORAL
  Filled 2016-11-24: qty 2

## 2016-11-24 NOTE — ED Provider Notes (Signed)
[facility] EMERGENCY DEPARTMENT Provider Note   CSN: [id number] Arrival date & time: 11/24/16  1210     History   Chief Complaint Chief Complaint  Patient presents with  . Knee Pain    left    HPI Miguel Robbins is a 36 y.o. male.  The history is provided by the patient.  Knee Pain   This is a chronic problem. Episode onset: 2 months. The problem occurs constantly. The problem has been gradually worsening. The pain is present in the left knee. The quality of the pain is described as aching and intermittent. The pain is at a severity of 5/10. The pain is moderate. Associated symptoms include limited range of motion. Pertinent negatives include no numbness and no stiffness. Associated symptoms comments: Pain with attempts at extreme flexion. . Treatments tried: Pt has tried ibuprofen without relief.  He is using a knee brace which is helpful. The treatment provided mild relief. There has been a history of trauma (Reports falling with valgus strain injury 2 months ago.).    Past Medical History:  Diagnosis Date  . Arm fracture, left   . Hernia   . Migraine   . Trichimoniasis     Patient Active Problem List   Diagnosis Date Noted  . Calculus of gallbladder with acute cholecystitis 12/24/2015  . Acute calculous cholecystitis 12/24/2015    Past Surgical History:  Procedure Laterality Date  . CHOLECYSTECTOMY N/A 12/25/2015   Procedure: LAPAROSCOPIC CHOLECYSTECTOMY;  Surgeon: Ancil Linsey, MD;  Location: [facility];  Service: General;  Laterality: N/A;  . HERNIA REPAIR    . HERNIA REPAIR    . left arm fracture         Home Medications    Prior to Admission medications   Medication Sig Start Date End Date Taking? Authorizing Provider  naproxen (NAPROSYN) 500 MG tablet Take 1 tablet (500 mg total) by mouth 2 (two) times daily. 11/24/16   Burgess Amor, PA-C  oxyCODONE-acetaminophen (ROXICET) 5-325 MG tablet Take 1 tablet by mouth every 4 (four) hours as needed for  severe pain. 12/25/15   Ancil Linsey, MD    Family History Family History  Problem Relation Age of Onset  . Hypertension Father   . Diabetes Father   . Hypertension Sister   . Diabetes Sister   . Hypertension Mother   . Hypertension Brother     Social History Social History  Substance Use Topics  . Smoking status: Current Every Day Smoker    Packs/day: 0.50    Years: 15.00    Types: Cigarettes  . Smokeless tobacco: Never Used  . Alcohol use No     Allergies   Penicillins; Bee venom; Clindamycin/lincomycin; and Cranberry   Review of Systems Review of Systems  Constitutional: Negative for fever.  Musculoskeletal: Positive for arthralgias and joint swelling. Negative for myalgias and stiffness.  Neurological: Negative for weakness and numbness.     Physical Exam Updated Vital Signs BP 117/84 (BP Location: Left Arm)   Pulse 80   Temp 98.1 F (36.7 C) (Oral)   Resp 16   Ht 5\' 7"  (1.702 m)   Wt 68 kg (150 lb)   SpO2 98%   BMI 23.49 kg/m   Physical Exam  Constitutional: He appears well-developed and well-nourished.  HENT:  Head: Atraumatic.  Neck: Normal range of motion.  Cardiovascular:  Pulses equal bilaterally  Musculoskeletal: He exhibits tenderness. He exhibits no deformity.       Left knee: He  exhibits decreased range of motion. He exhibits no LCL laxity, normal patellar mobility, normal meniscus and no MCL laxity. Tenderness found. Lateral joint line tenderness noted.   Pain with flexion beyond 90. No crepitus.no palpable effusion.  Neurological: He is alert. He has normal strength. He displays normal reflexes. No sensory deficit.  Skin: Skin is warm and dry.  Psychiatric: He has a normal mood and affect.     ED Treatments / Results  Labs (all labs ordered are listed, but only abnormal results are displayed) Labs Reviewed - No data to display  EKG  EKG Interpretation None       Radiology Dg Knee Complete 4 Views Left  Result  Date: 11/24/2016 CLINICAL DATA:  Pain following fall EXAM: LEFT KNEE - COMPLETE 4+ VIEW COMPARISON:  None. FINDINGS: Frontal, lateral, and bilateral oblique views were obtained. There is no fracture or dislocation. There is a small joint effusion. Joint spaces appear normal. No erosive change. There is a bone island in the proximal tibial metaphysis laterally. IMPRESSION: Small joint effusion. No fracture or dislocation. No evident arthropathy. Electronically Signed   By: Bretta BangWilliam  Woodruff III M.D.   On: 11/24/2016 13:21    Procedures Procedures (including critical care time)  Medications Ordered in ED Medications  naproxen (NAPROSYN) tablet 500 mg (500 mg Oral Given 11/24/16 1357)     Initial Impression / Assessment and Plan / ED Course  I have reviewed the triage vital signs and the nursing notes.  Pertinent labs & imaging results that were available during my care of the patient were reviewed by me and considered in my medical decision making (see chart for details).     Suspect lateral meniscal injury. Advised patient to continue wearing his brace.  Heat therapy, minimize activities that worsen his symptoms. He was referred to orthopedics for further evaluation.  Final Clinical Impressions(s) / ED Diagnoses   Final diagnoses:  Effusion of left knee    New Prescriptions Discharge Medication List as of 11/24/2016  1:50 PM    START taking these medications   Details  naproxen (NAPROSYN) 500 MG tablet Take 1 tablet (500 mg total) by mouth 2 (two) times daily., Starting Tue 11/24/2016, Print         Burgess AmorIdol, Tashica Provencio, PA-C 11/24/16 1449    Samuel JesterMcManus, Kathleen, DO 11/27/16 [phone]

## 2016-11-24 NOTE — ED Triage Notes (Signed)
C/o pain to left knee about 3 months ago.  Denies injury.

## 2016-11-24 NOTE — Discharge Instructions (Signed)
As discussed, your exam suggests a possible cartilage (meniscal) injury of your knee.  Continue wearing your knee sleeve for comfort.  Use ice for any swelling, heat 20 minutes several times daily. Avoid bending your knee more than 90 degrees.  Call Dr. Romeo AppleHarrison for further evaluation of your knee.

## 2017-10-17 ENCOUNTER — Emergency Department (HOSPITAL_COMMUNITY)
Admission: EM | Admit: 2017-10-17 | Discharge: 2017-10-17 | Disposition: A | Discharge status: Home / Self Care | Payer: Self-pay | Attending: Emergency Medicine | Admitting: Emergency Medicine

## 2017-10-17 ENCOUNTER — Emergency Department (HOSPITAL_COMMUNITY): Payer: Self-pay

## 2017-10-17 ENCOUNTER — Encounter (HOSPITAL_COMMUNITY): Payer: Self-pay | Admitting: Emergency Medicine

## 2017-10-17 DIAGNOSIS — M25562 Pain in left knee: Secondary | ICD-10-CM | POA: Insufficient documentation

## 2017-10-17 DIAGNOSIS — F17210 Nicotine dependence, cigarettes, uncomplicated: Secondary | ICD-10-CM | POA: Insufficient documentation

## 2017-10-17 MED ORDER — NAPROXEN 500 MG PO TABS
500.0000 mg | ORAL_TABLET | Freq: Two times a day (BID) | ORAL | 0 refills | Status: DC
Start: 2017-10-17 — End: 2018-01-12

## 2017-10-17 NOTE — ED Triage Notes (Signed)
Pt reports left knee pain x 2 weeks since falling in football practice.  States the pain has been increasing with activity.

## 2017-10-17 NOTE — Discharge Instructions (Signed)
As discussed I suspect you may have an injury to your cartilage which usually with improve with being careful on the knee (no extreme use, flexing, twisting) as discussed but sometimes will need steroid injections and if severe, surgery.  Call Dr. Romeo AppleHarrison for further management of this problem.  In the interim, use ice as much as is comfortable for the next several days.  Wear the splint for compression and comfort.  Your xray is negative.

## 2017-10-18 NOTE — ED Provider Notes (Signed)
[facility] EMERGENCY DEPARTMENT Provider Note   CSN: [id number] Arrival date & time: 10/17/17  1043     History   Chief Complaint Chief Complaint  Patient presents with  . Knee Pain    HPI Miguel Robbins is a 37 y.o. 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  Since the event he has had pain and a popping sensation most noticeable with going up and down steps.  He has been using ice and ibuprofen but pain has again escalated after a "planting and twisting" movement.  He denies knee weakness or locking, but has intermittent stabs of pain and when this happens feels he could fall. He has found no alleviators for his sx except for rest and non weight bearing.  The history is provided by the patient.    Past Medical History:  Diagnosis Date  . Arm fracture, left   . Hernia   . Migraine   . Trichimoniasis     Patient Active Problem List   Diagnosis Date Noted  . Calculus of gallbladder with acute cholecystitis 12/24/2015  . Acute calculous cholecystitis 12/24/2015    Past Surgical History:  Procedure Laterality Date  . CHOLECYSTECTOMY N/A 12/25/2015   Procedure: LAPAROSCOPIC CHOLECYSTECTOMY;  Surgeon: Ancil Linsey, MD;  Location: [facility];  Service: General;  Laterality: N/A;  . HERNIA REPAIR    . HERNIA REPAIR    . left arm fracture          Home Medications    Prior to Admission medications   Medication Sig Start Date End Date Taking? Authorizing Provider  naproxen (NAPROSYN) 500 MG tablet Take 1 tablet (500 mg total) by mouth 2 (two) times daily. 10/17/17   Burgess Amor, PA-C  oxyCODONE-acetaminophen (ROXICET) 5-325 MG tablet Take 1 tablet by mouth every 4 (four) hours as needed for severe pain. 12/25/15   Ancil Linsey, MD    Family History Family History  Problem Relation Age of Onset  . Hypertension Father   . Diabetes Father   . Hypertension Sister   . Diabetes  Sister   . Hypertension Mother   . Hypertension Brother     Social History Social History   Tobacco Use  . Smoking status: Current Every Day Smoker    Packs/day: 0.50    Years: 15.00    Pack years: 7.50    Types: Cigarettes  . Smokeless tobacco: Never Used  Substance Use Topics  . Alcohol use: No  . Drug use: No     Allergies   Penicillins; Bee venom; Clindamycin/lincomycin; and Cranberry   Review of Systems Review of Systems  Constitutional: Negative for fever.  Musculoskeletal: Positive for arthralgias and joint swelling. Negative for myalgias.  Neurological: Negative for weakness and numbness.     Physical Exam Updated Vital Signs BP 120/89 (BP Location: Right Arm)   Pulse 66   Temp 98.6 F (37 C) (Oral)   Resp 14   Ht 5\' 7"  (1.702 m)   Wt 99.8 kg   SpO2 99%   BMI 34.46 kg/m   Physical Exam  Constitutional: He appears well-developed and well-nourished.  HENT:  Head: Atraumatic.  Neck: Normal range of motion.  Cardiovascular:  Pulses equal bilaterally  Musculoskeletal: He exhibits tenderness. He exhibits no edema or deformity.       Left knee: He exhibits no swelling, no effusion, no deformity, no erythema, normal alignment, no  LCL laxity and no MCL laxity. Tenderness found. Medial joint line tenderness noted. No MCL, no LCL and no patellar tendon tenderness noted.  Neurological: He is alert. He has normal strength. He displays normal reflexes. No sensory deficit.  Skin: Skin is warm and dry.  Psychiatric: He has a normal mood and affect.     ED Treatments / Results  Labs (all labs ordered are listed, but only abnormal results are displayed) Labs Reviewed - No data to display  EKG None  Radiology Dg Knee Complete 4 Views Left  Result Date: 10/17/2017 CLINICAL DATA:  Fall while playing football 2 weeks ago with left knee pain. Initial encounter. EXAM: LEFT KNEE - COMPLETE 4+ VIEW COMPARISON:  11/24/2016 FINDINGS: Oblique lateral view with no  detected joint fluid. No fracture or malalignment. IMPRESSION: Negative. Electronically Signed   By: Marnee SpringJonathon  Watts M.D.   On: 10/17/2017 11:38    Procedures Procedures (including critical care time)  Medications Ordered in ED Medications - No data to display   Initial Impression / Assessment and Plan / ED Course  I have reviewed the triage vital signs and the nursing notes.  Pertinent labs & imaging results that were available during my care of the patient were reviewed by me and considered in my medical decision making (see chart for details).     Suspect possible meniscal injury.  RICE, knee sleeve, crutches.  Ortho referral given for f/u care. Imaging negative for bony injury.  Final Clinical Impressions(s) / ED Diagnoses   Final diagnoses:  Acute pain of left knee    ED Discharge Orders         Ordered    naproxen (NAPROSYN) 500 MG tablet  2 times daily     10/17/17 1234           Burgess Amordol, Chardae Mulkern, PA-C 10/18/17 0813    Bethann BerkshireZammit, Joseph, MD 10/18/17 [phone]

## 2018-01-12 ENCOUNTER — Other Ambulatory Visit: Payer: Self-pay

## 2018-01-12 ENCOUNTER — Emergency Department (HOSPITAL_COMMUNITY)
Admission: EM | Admit: 2018-01-12 | Discharge: 2018-01-12 | Disposition: A | Discharge status: Home / Self Care | Payer: Self-pay | Attending: Emergency Medicine | Admitting: Emergency Medicine

## 2018-01-12 ENCOUNTER — Emergency Department (HOSPITAL_COMMUNITY): Payer: Self-pay

## 2018-01-12 ENCOUNTER — Encounter (HOSPITAL_COMMUNITY): Payer: Self-pay | Admitting: Emergency Medicine

## 2018-01-12 DIAGNOSIS — Y9361 Activity, american tackle football: Secondary | ICD-10-CM | POA: Insufficient documentation

## 2018-01-12 DIAGNOSIS — F17210 Nicotine dependence, cigarettes, uncomplicated: Secondary | ICD-10-CM | POA: Insufficient documentation

## 2018-01-12 DIAGNOSIS — S63635A Sprain of interphalangeal joint of left ring finger, initial encounter: Secondary | ICD-10-CM | POA: Insufficient documentation

## 2018-01-12 DIAGNOSIS — Y999 Unspecified external cause status: Secondary | ICD-10-CM | POA: Insufficient documentation

## 2018-01-12 DIAGNOSIS — X58XXXA Exposure to other specified factors, initial encounter: Secondary | ICD-10-CM | POA: Insufficient documentation

## 2018-01-12 DIAGNOSIS — Y929 Unspecified place or not applicable: Secondary | ICD-10-CM | POA: Insufficient documentation

## 2018-01-12 NOTE — ED Provider Notes (Signed)
[facility] EMERGENCY DEPARTMENT Provider Note   CSN: [id number] Arrival date & time: 01/12/18  2148     History   Chief Complaint Chief Complaint  Patient presents with  . Hand Pain    HPI Miguel Robbins is a 37 y.o. male.  Pt presents to the ED today with left ring finger pain.  He said he hurt it playing football about 1 month ago, and it's not been the same.  He said he still can't make a fist.  He is right handed.  No other injury.     Past Medical History:  Diagnosis Date  . Arm fracture, left   . Hernia   . Migraine   . Trichimoniasis     Patient Active Problem List   Diagnosis Date Noted  . Calculus of gallbladder with acute cholecystitis 12/24/2015  . Acute calculous cholecystitis 12/24/2015    Past Surgical History:  Procedure Laterality Date  . CHOLECYSTECTOMY N/A 12/25/2015   Procedure: LAPAROSCOPIC CHOLECYSTECTOMY;  Surgeon: Ancil Linsey, MD;  Location: [facility];  Service: General;  Laterality: N/A;  . HERNIA REPAIR    . HERNIA REPAIR    . left arm fracture          Home Medications    Prior to Admission medications   Not on File    Family History Family History  Problem Relation Age of Onset  . Hypertension Father   . Diabetes Father   . Hypertension Sister   . Diabetes Sister   . Hypertension Mother   . Hypertension Brother     Social History Social History   Tobacco Use  . Smoking status: Current Every Day Smoker    Packs/day: 0.50    Years: 15.00    Pack years: 7.50    Types: Cigarettes  . Smokeless tobacco: Never Used  Substance Use Topics  . Alcohol use: No  . Drug use: No     Allergies   Penicillins; Bee venom; Clindamycin/lincomycin; and Cranberry   Review of Systems Review of Systems  Musculoskeletal:       Left ring finger pain  All other systems reviewed and are negative.    Physical Exam Updated Vital Signs BP 131/72   Pulse (!) 103   Temp 97.9 F (36.6 C) (Oral)   Resp 16   Ht 5\' 7"   (1.702 m)   Wt 96.6 kg   SpO2 100%   BMI 33.36 kg/m   Physical Exam  Constitutional: He is oriented to person, place, and time. He appears well-developed and well-nourished.  HENT:  Head: Normocephalic and atraumatic.  Right Ear: External ear normal.  Left Ear: External ear normal.  Nose: Nose normal.  Mouth/Throat: Oropharynx is clear and moist.  Eyes: Pupils are equal, round, and reactive to light. Conjunctivae and EOM are normal.  Neck: Normal range of motion. Neck supple.  Cardiovascular: Normal rate, regular rhythm, normal heart sounds and intact distal pulses.  Pulmonary/Chest: Effort normal and breath sounds normal.  Abdominal: Soft. Bowel sounds are normal.  Musculoskeletal:       Hands: Neurological: He is alert and oriented to person, place, and time.  Skin: Skin is warm. Capillary refill takes less than 2 seconds.  Psychiatric: He has a normal mood and affect. His behavior is normal. Judgment and thought content normal.  Nursing note and vitals reviewed.    ED Treatments / Results  Labs (all labs ordered are listed, but only abnormal results are displayed) Labs Reviewed - No  data to display  EKG None  Radiology Dg Finger Ring Left  Result Date: 01/12/2018 CLINICAL DATA:  Finger injury 1 month ago persistent pain EXAM: LEFT RING FINGER 2+V COMPARISON:  None. FINDINGS: There is no evidence of fracture or dislocation. There is no evidence of arthropathy or other focal bone abnormality. Soft tissues are unremarkable. IMPRESSION: Negative. Electronically Signed   By: Jasmine PangKim  Fujinaga M.D.   On: 01/12/2018 22:39    Procedures Procedures (including critical care time)  Medications Ordered in ED Medications - No data to display   Initial Impression / Assessment and Plan / ED Course  I have reviewed the triage vital signs and the nursing notes.  Pertinent labs & imaging results that were available during my care of the patient were reviewed by me and considered in  my medical decision making (see chart for details).    No fx on Xray.  Pt will be placed in a splint and instructed to f/u with hand.  Return if worse.  Final Clinical Impressions(s) / ED Diagnoses   Final diagnoses:  Sprain of interphalangeal joint of left ring finger, initial encounter    ED Discharge Orders    None       Jacalyn LefevreHaviland, Infant Doane, MD 01/12/18 2246

## 2018-01-12 NOTE — ED Triage Notes (Signed)
Pt c/o left ring finger pain x one month.

## 2018-06-13 ENCOUNTER — Encounter (HOSPITAL_COMMUNITY): Payer: Self-pay

## 2018-06-13 ENCOUNTER — Emergency Department (HOSPITAL_COMMUNITY)
Admission: EM | Admit: 2018-06-13 | Discharge: 2018-06-13 | Disposition: A | Discharge status: Home / Self Care | Payer: Self-pay | Attending: Emergency Medicine | Admitting: Emergency Medicine

## 2018-06-13 ENCOUNTER — Other Ambulatory Visit: Payer: Self-pay

## 2018-06-13 ENCOUNTER — Emergency Department (HOSPITAL_COMMUNITY): Payer: Self-pay

## 2018-06-13 DIAGNOSIS — Z79899 Other long term (current) drug therapy: Secondary | ICD-10-CM | POA: Insufficient documentation

## 2018-06-13 DIAGNOSIS — R3121 Asymptomatic microscopic hematuria: Secondary | ICD-10-CM | POA: Insufficient documentation

## 2018-06-13 DIAGNOSIS — F17210 Nicotine dependence, cigarettes, uncomplicated: Secondary | ICD-10-CM | POA: Insufficient documentation

## 2018-06-13 HISTORY — DX: Hyperlipidemia, unspecified: E78.5

## 2018-06-13 LAB — CBC WITH DIFFERENTIAL/PLATELET
Abs Immature Granulocytes: 0.01 10*3/uL (ref 0.00–0.07)
Basophils Absolute: 0 10*3/uL (ref 0.0–0.1)
Basophils Relative: 1 %
Eosinophils Absolute: 0.1 10*3/uL (ref 0.0–0.5)
Eosinophils Relative: 1 %
HCT: 43.2 % (ref 39.0–52.0)
Hemoglobin: 14.1 g/dL (ref 13.0–17.0)
Immature Granulocytes: 0 %
Lymphocytes Relative: 46 %
Lymphs Abs: 1.9 10*3/uL (ref 0.7–4.0)
MCH: 30.1 pg (ref 26.0–34.0)
MCHC: 32.6 g/dL (ref 30.0–36.0)
MCV: 92.3 fL (ref 80.0–100.0)
Monocytes Absolute: 0.3 10*3/uL (ref 0.1–1.0)
Monocytes Relative: 7 %
Neutro Abs: 1.9 10*3/uL (ref 1.7–7.7)
Neutrophils Relative %: 45 %
Platelets: 296 10*3/uL (ref 150–400)
RBC: 4.68 MIL/uL (ref 4.22–5.81)
RDW: 13.4 % (ref 11.5–15.5)
WBC: 4.2 10*3/uL (ref 4.0–10.5)
nRBC: 0 % (ref 0.0–0.2)

## 2018-06-13 LAB — COMPREHENSIVE METABOLIC PANEL
ALT: 19 U/L (ref 0–44)
AST: 16 U/L (ref 15–41)
Albumin: 4.2 g/dL (ref 3.5–5.0)
Alkaline Phosphatase: 52 U/L (ref 38–126)
Anion gap: 11 (ref 5–15)
BUN: 10 mg/dL (ref 6–20)
CO2: 23 mmol/L (ref 22–32)
Calcium: 9.5 mg/dL (ref 8.9–10.3)
Chloride: 107 mmol/L (ref 98–111)
Creatinine, Ser: 0.63 mg/dL (ref 0.61–1.24)
GFR calc Af Amer: >60 mL/min (ref >60)
GFR calc non Af Amer: >60 mL/min (ref >60)
Glucose, Bld: 103 mg/dL — ABNORMAL HIGH (ref 70–99)
Potassium: 3.3 mmol/L — ABNORMAL LOW (ref 3.5–5.1)
Sodium: 141 mmol/L (ref 135–145)
Total Bilirubin: 1 mg/dL (ref 0.3–1.2)
Total Protein: 7.4 g/dL (ref 6.5–8.1)

## 2018-06-13 LAB — URINALYSIS, ROUTINE W REFLEX MICROSCOPIC
Bacteria, UA: NONE SEEN
Bilirubin Urine: NEGATIVE
Glucose, UA: NEGATIVE mg/dL
Ketones, ur: NEGATIVE mg/dL
Leukocytes,Ua: NEGATIVE
Nitrite: NEGATIVE
Protein, ur: 100 mg/dL — AB
Specific Gravity, Urine: 1.024 (ref 1.005–1.030)
pH: 5 (ref 5.0–8.0)

## 2018-06-13 NOTE — ED Notes (Signed)
Pt returned from CT °

## 2018-06-13 NOTE — ED Provider Notes (Signed)
[facility] EMERGENCY DEPARTMENT Provider Note   CSN: [id number] Arrival date & time: 06/13/18  1020    History   Chief Complaint Chief Complaint  Patient presents with   Hematuria    HPI Miguel Robbins is a 38 y.o. male with a history of hyperlipidemia, s/p cholecystectomy with a 2 week history of intermittent hematuria.  He reports being a "heavy drinker" on his days off and noted painless hematuria in the past 2 weeks, always the morning after drinking heavy, most recently this am.  He denies penile discharge, pelvic or suprapubic pain, no dysuria, urgency or increased frequency, also no upper abdominal pain but does report having an approximate 10 minute episode of severe pain in his left flank yesterday while trying to eat lunch which has not returned.  He denies fevers, chills, n/v, unexplained bruising or other bleeding sites.       The history is provided by the patient.    Past Medical History:  Diagnosis Date   Arm fracture, left    Hernia    Hyperlipidemia    Migraine    Trichimoniasis     Patient Active Problem List   Diagnosis Date Noted   Calculus of gallbladder with acute cholecystitis 12/24/2015   Acute calculous cholecystitis 12/24/2015    Past Surgical History:  Procedure Laterality Date   CHOLECYSTECTOMY N/A 12/25/2015   Procedure: LAPAROSCOPIC CHOLECYSTECTOMY;  Surgeon: Ancil LinseyJason Evan Davis, MD;  Location: [facility];  Service: General;  Laterality: N/A;   HERNIA REPAIR     HERNIA REPAIR     left arm fracture          Home Medications    Prior to Admission medications   Medication Sig Start Date End Date Taking? Authorizing Provider  atorvastatin (LIPITOR) 10 MG tablet Take 10 mg by mouth daily.    [provider]    Family History Family History  Problem Relation Age of Onset   Hypertension Father    Diabetes Father    Hypertension Sister    Diabetes Sister    Hypertension Mother    Hypertension Brother      Social History Social History   Tobacco Use   Smoking status: Current Every Day Smoker    Packs/day: 0.50    Years: 15.00    Pack years: 7.50    Types: Cigarettes   Smokeless tobacco: Never Used  Substance Use Topics   Alcohol use: Yes    Comment: 1/2 gallon weekly   Drug use: No     Allergies   Penicillins; Bee venom; Clindamycin/lincomycin; and Cranberry   Review of Systems Review of Systems  Constitutional: Negative for chills and fever.  HENT: Negative for congestion and sore throat.   Eyes: Negative.   Respiratory: Negative for chest tightness and shortness of breath.   Cardiovascular: Negative for chest pain.  Gastrointestinal: Negative for abdominal pain, blood in stool, nausea and vomiting.  Genitourinary: Positive for flank pain and hematuria. Negative for discharge, dysuria, penile pain and urgency.  Musculoskeletal: Negative for arthralgias, joint swelling and neck pain.  Skin: Negative.  Negative for rash and wound.  Neurological: Negative for dizziness, weakness, light-headedness, numbness and headaches.  Psychiatric/Behavioral: Negative.      Physical Exam Updated Vital Signs BP (!) 149/85 (BP Location: Left Arm)    Pulse 60    Temp 97.6 F (36.4 C) (Oral)    Resp 16    SpO2 96%   Physical Exam Vitals signs and nursing note reviewed.  Constitutional:      Appearance: He is well-developed.  HENT:     Head: Normocephalic and atraumatic.  Eyes:     Conjunctiva/sclera: Conjunctivae normal.  Neck:     Musculoskeletal: Normal range of motion.  Cardiovascular:     Rate and Rhythm: Normal rate and regular rhythm.     Heart sounds: Normal heart sounds.  Pulmonary:     Effort: Pulmonary effort is normal.     Breath sounds: Normal breath sounds. No wheezing.  Abdominal:     General: Bowel sounds are normal. There is no distension.     Palpations: Abdomen is soft.     Tenderness: There is no abdominal tenderness. There is no right CVA  tenderness, left CVA tenderness or guarding.  Musculoskeletal: Normal range of motion.  Skin:    General: Skin is warm and dry.  Neurological:     Mental Status: He is alert.      ED Treatments / Results  Labs (all labs ordered are listed, but only abnormal results are displayed) Labs Reviewed  URINALYSIS, ROUTINE W REFLEX MICROSCOPIC - Abnormal; Notable for the following components:      Result Value   Hgb urine dipstick SMALL (*)    Protein, ur 100 (*)    All other components within normal limits  COMPREHENSIVE METABOLIC PANEL - Abnormal; Notable for the following components:   Potassium 3.3 (*)    Glucose, Bld 103 (*)    All other components within normal limits  CBC WITH DIFFERENTIAL/PLATELET  GC/CHLAMYDIA PROBE AMP ([place]) NOT AT [facility]    EKG None  Radiology Ct Renal Stone Study  Result Date: 06/13/2018 CLINICAL DATA:  Hematuria.  Intermittent left flank pain. EXAM: CT ABDOMEN AND PELVIS WITHOUT CONTRAST TECHNIQUE: Multidetector CT imaging of the abdomen and pelvis was performed following the standard protocol without IV contrast. COMPARISON:  CT scan dated 08/28/2016 FINDINGS: Lower chest: Normal. Hepatobiliary: There is a tiny focal area of fatty infiltration in the left lobe adjacent to the falciform ligament. Hepatic parenchyma is otherwise normal. Cholecystectomy. No dilated bile ducts. Pancreas: Unremarkable. No pancreatic ductal dilatation or surrounding inflammatory changes. Spleen: Normal in size without focal abnormality. Adrenals/Urinary Tract: Adrenal glands are unremarkable. Kidneys are normal, without renal calculi, focal lesion, or hydronephrosis. Bladder is unremarkable. Small phlebolith in the right side of the pelvis is immediately adjacent to the normal appearing right ureter and is unchanged. Stomach/Bowel: Stomach is within normal limits. Appendix appears normal but extends across the midline into the left mid abdomen. No evidence of bowel wall  thickening, distention, or inflammatory changes. Vascular/Lymphatic: No significant vascular findings are present. No enlarged abdominal or pelvic lymph nodes. Reproductive: Prostate is unremarkable. Other: No abdominal wall hernia or abnormality. No abdominopelvic ascites. Musculoskeletal: No acute or significant osseous findings. IMPRESSION: Benign-appearing abdomen and pelvis. Electronically Signed   By: Francene Boyers M.D.   On: 06/13/2018 12:04    Procedures Procedures (including critical care time)  Medications Ordered in ED Medications - No data to display   Initial Impression / Assessment and Plan / ED Course  I have reviewed the triage vital signs and the nursing notes.  Pertinent labs & imaging results that were available during my care of the patient were reviewed by me and considered in my medical decision making (see chart for details).        Pt with macroscopic hematuria associated with increased ETOH intake only per pt report.  Mostly asymptomatic except for flank pain x  1 ytd.  CT renal negative for acute pathology including renal/ureteral stone.  Sx free in ed, no dysuria, no penile dc. He does report unprotected sex, partner asymptomatic. Gc/chlamydia cx pending.  No bacteruria or urine wbc's, doubt infectious source.  Pt referred to urology for f/u evaluation.  Discussed etoh cessation which pt already confirmed he "has stopped drinking for good".    Final Clinical Impressions(s) / ED Diagnoses   Final diagnoses:  Asymptomatic microscopic hematuria    ED Discharge Orders    None       Victoriano Lain 06/13/18 1538    Eber Hong, MD 06/14/18 [phone]

## 2018-06-13 NOTE — Discharge Instructions (Addendum)
Your lab tests and CT imaging today are normal for any signs of kidney (or liver) problems and you do not have a kidney stone.  You do have red blood cells in your urine which is not normal and you will benefit from seeing a urologist as discussed.  Call Dr. Annabell Howells for an office visit as discussed. In the interim, cessation of alcohol intake is highly recommended.  Your gonorrhea and chlamydia tests will be resulted in about 2 days, but your symptoms do not suggest an STD.

## 2018-06-13 NOTE — ED Triage Notes (Addendum)
Pt reports blood in urine started 2 weeks ago. Reports when he drinks heavily he bleeds.. One episode of pain yesterday

## 2018-06-13 NOTE — ED Notes (Signed)
Patient transported to CT 

## 2018-06-14 LAB — GC/CHLAMYDIA PROBE AMP (~~LOC~~) NOT AT ARMC
Chlamydia: NEGATIVE
Neisseria Gonorrhea: NEGATIVE

## 2018-07-30 ENCOUNTER — Emergency Department (HOSPITAL_COMMUNITY)
Admission: EM | Admit: 2018-07-30 | Discharge: 2018-07-30 | Disposition: A | Discharge status: Home / Self Care | Payer: Self-pay | Attending: Emergency Medicine | Admitting: Emergency Medicine

## 2018-07-30 ENCOUNTER — Other Ambulatory Visit: Payer: Self-pay

## 2018-07-30 ENCOUNTER — Encounter (HOSPITAL_COMMUNITY): Payer: Self-pay | Admitting: Emergency Medicine

## 2018-07-30 DIAGNOSIS — S61411A Laceration without foreign body of right hand, initial encounter: Secondary | ICD-10-CM

## 2018-07-30 DIAGNOSIS — Z23 Encounter for immunization: Secondary | ICD-10-CM | POA: Insufficient documentation

## 2018-07-30 DIAGNOSIS — S61511A Laceration without foreign body of right wrist, initial encounter: Secondary | ICD-10-CM | POA: Insufficient documentation

## 2018-07-30 DIAGNOSIS — W268XXA Contact with other sharp object(s), not elsewhere classified, initial encounter: Secondary | ICD-10-CM | POA: Insufficient documentation

## 2018-07-30 DIAGNOSIS — Z79899 Other long term (current) drug therapy: Secondary | ICD-10-CM | POA: Insufficient documentation

## 2018-07-30 DIAGNOSIS — Y999 Unspecified external cause status: Secondary | ICD-10-CM | POA: Insufficient documentation

## 2018-07-30 DIAGNOSIS — Y929 Unspecified place or not applicable: Secondary | ICD-10-CM | POA: Insufficient documentation

## 2018-07-30 DIAGNOSIS — Y9389 Activity, other specified: Secondary | ICD-10-CM | POA: Insufficient documentation

## 2018-07-30 DIAGNOSIS — F17210 Nicotine dependence, cigarettes, uncomplicated: Secondary | ICD-10-CM | POA: Insufficient documentation

## 2018-07-30 MED ORDER — LIDOCAINE HCL (PF) 1 % IJ SOLN
2.0000 mL | Freq: Once | INTRAMUSCULAR | Status: AC
  Administered 2018-07-30: 13:00:00 2 mL via INTRADERMAL
  Filled 2018-07-30: qty 2

## 2018-07-30 MED ORDER — TETANUS-DIPHTH-ACELL PERTUSSIS 5-2.5-18.5 LF-MCG/0.5 IM SUSP
0.5000 mL | Freq: Once | INTRAMUSCULAR | Status: AC
  Administered 2018-07-30: 13:00:00 0.5 mL via INTRAMUSCULAR
  Filled 2018-07-30: qty 0.5

## 2018-07-30 MED ORDER — ACETAMINOPHEN 325 MG PO TABS
650.0000 mg | ORAL_TABLET | Freq: Once | ORAL | Status: AC
  Administered 2018-07-30: 13:00:00 650 mg via ORAL
  Filled 2018-07-30: qty 2

## 2018-07-30 NOTE — ED Triage Notes (Signed)
Patient c/o laceration to posterior, right wrist. Per patient cut wrist on piece of metal from motorcycle while working. Patient states last tetanus vaccination was in 2009. Patient has toilet paper and electrical tap around laceration. Bleeding controled at this time. Dressing removed and redressed with telfa, gauze and kerlex.

## 2018-07-30 NOTE — ED Notes (Signed)
Non stick telfa dressing applied. 

## 2018-07-30 NOTE — ED Provider Notes (Signed)
[facility] EMERGENCY DEPARTMENT Provider Note   CSN: [id number] Arrival date & time: 07/30/18  1208    History   Chief Complaint Chief Complaint  Patient presents with  . Laceration    HPI Miguel Robbins is a 38 y.o. male.     38 y.o male with a PMH of Hyperlipidemia, Migraine presents to the ED s/p laceration x 2 hours ago. Patient reports he was fixing his scooter when he was cut by the metal of the scooter on the right dorsum aspect of his wrist. He reports running his hand through the water after the incident. He then wrapped his hand with toilet paper and electrical cord. He has not taken any medication for relieve in symptoms. He reports no pain along the hand, decrease sensation or decrease in strength.  Patient's last tetanus vaccine was 2009.   The history is provided by the patient and medical records.  Laceration Associated symptoms: no fever     Past Medical History:  Diagnosis Date  . Arm fracture, left   . Hernia   . Hyperlipidemia   . Migraine   . Trichimoniasis     Patient Active Problem List   Diagnosis Date Noted  . Calculus of gallbladder with acute cholecystitis 12/24/2015  . Acute calculous cholecystitis 12/24/2015    Past Surgical History:  Procedure Laterality Date  . CHOLECYSTECTOMY N/A 12/25/2015   Procedure: LAPAROSCOPIC CHOLECYSTECTOMY;  Surgeon: Ancil LinseyJason Evan Davis, MD;  Location: [facility];  Service: General;  Laterality: N/A;  . HERNIA REPAIR    . HERNIA REPAIR    . left arm fracture          Home Medications    Prior to Admission medications   Medication Sig Start Date End Date Taking? Authorizing Provider  atorvastatin (LIPITOR) 10 MG tablet Take 10 mg by mouth daily.    [provider]    Family History Family History  Problem Relation Age of Onset  . Hypertension Father   . Diabetes Father   . Hypertension Sister   . Diabetes Sister   . Hypertension Mother   . Hypertension Brother     Social History  Social History   Tobacco Use  . Smoking status: Current Every Day Smoker    Packs/day: 0.50    Years: 15.00    Pack years: 7.50    Types: Cigarettes  . Smokeless tobacco: Never Used  Substance Use Topics  . Alcohol use: Yes    Comment: 1/2 gallon weekly  . Drug use: No     Allergies   Penicillins, Bee venom, Clindamycin/lincomycin, and Cranberry   Review of Systems Review of Systems  Constitutional: Negative for fever.  Skin: Positive for wound.     Physical Exam Updated Vital Signs BP 129/77 (BP Location: Left Arm)   Pulse 62   Temp (!) 97.5 F (36.4 C) (Temporal)   Resp 16   Ht 5\' 7"  (1.702 m)   Wt 104.3 kg   SpO2 98%   BMI 36.02 kg/m   Physical Exam Vitals signs and nursing note reviewed.  Constitutional:      Appearance: He is well-developed.  HENT:     Head: Normocephalic and atraumatic.  Eyes:     General: No scleral icterus.    Pupils: Pupils are equal, round, and reactive to light.  Neck:     Musculoskeletal: Normal range of motion.  Cardiovascular:     Heart sounds: Normal heart sounds.  Pulmonary:     Effort:  Pulmonary effort is normal.  Chest:     Chest wall: No tenderness.  Abdominal:     General: There is no distension.     Palpations: Abdomen is soft.     Tenderness: There is no abdominal tenderness.  Musculoskeletal:        General: Signs of injury present. No tenderness or deformity.     Right hand: He exhibits laceration. He exhibits normal range of motion, no tenderness, no bony tenderness, normal capillary refill and no deformity. Normal sensation noted. Normal strength noted.       Hands:     Comments: Pulses present, capillary refill is intact.  5 out of 5 strength with hand flexion, hand extension.  No decrease in strength with wrist flexion and extension.  Skin:    General: Skin is warm and dry.  Neurological:     Mental Status: He is alert and oriented to person, place, and time.      ED Treatments / Results  Labs  (all labs ordered are listed, but only abnormal results are displayed) Labs Reviewed - No data to display  EKG None  Radiology No results found.  Procedures .Marland Kitchen.Laceration Repair  Date/Time: 07/30/2018 1:02 PM Performed by: Claude MangesSoto, Yelina Sarratt, PA-C Authorized by: Claude MangesSoto, Jonnae Fonseca, PA-C   Consent:    Consent obtained:  Verbal   Consent given by:  Patient   Risks discussed:  Infection and pain   Alternatives discussed:  No treatment Anesthesia (see MAR for exact dosages):    Anesthesia method:  Local infiltration   Local anesthetic:  Lidocaine 1% w/o epi Laceration details:    Location:  Hand   Hand location:  R wrist   Length (cm):  2   Depth (mm):  0.5 Repair type:    Repair type:  Simple Exploration:    Hemostasis achieved with:  Direct pressure   Wound exploration: wound explored through full range of motion and entire depth of wound probed and visualized     Wound extent: no nerve damage noted   Treatment:    Area cleansed with:  Saline   Amount of cleaning:  Extensive   Irrigation solution:  Sterile saline Skin repair:    Repair method:  Sutures   Suture size:  4-0   Suture material:  Prolene   Suture technique:  Simple interrupted Approximation:    Approximation:  Close Post-procedure details:    Dressing:  Open (no dressing)   Patient tolerance of procedure:  Tolerated well, no immediate complications   (including critical care time)  Medications Ordered in ED Medications  Tdap (BOOSTRIX) injection 0.5 mL (has no administration in time range)  lidocaine (PF) (XYLOCAINE) 1 % injection 2 mL (has no administration in time range)  acetaminophen (TYLENOL) tablet 650 mg (has no administration in time range)     Initial Impression / Assessment and Plan / ED Course  I have reviewed the triage vital signs and the nursing notes.  Pertinent labs & imaging results that were available during my care of the patient were reviewed by me and considered in my medical decision  making (see chart for details).       Patient with no pertinent past medical history presents to the ED status post laceration while working on his moped today.  Reports he grazed part of his right hand with a metal part of the moped, bleeding began he poured water on it along with cleaned it.  He then placed a liver along with wrapped it  with electrical cord.  During primary evaluation patient is well-appearing, dressing was applied by triage staff, he is neurovascular intact on his right hand.  Reports there was no metal pieces shed during the incident, reports slicing his hand with the metal portion of the moped.  During primary evaluation laceration appears very superficial 0.5-.  Will clean and irrigate with saline, along place sutures. Last and his vaccine was 2009, will update for patient now.  Patient given Tylenol for the pain prior to procedure.  Patient's tetanus status was updated, I have placed 2 stitches to the right dorsum aspect of his wrist, patient tolerated procedure without any complications.  Patient stable for discharge, advised to have suture removed within 7 to 10 days.  Patient has no recent management.  Return precautions provided.   Portions of this note were generated with Lobbyist. Dictation errors may occur despite best attempts at proofreading.   Final Clinical Impressions(s) / ED Diagnoses   Final diagnoses:  Laceration of right hand without foreign body, initial encounter    ED Discharge Orders    None       Janeece Fitting, PA-C 07/30/18 1304    Noemi Chapel, MD 08/02/18 [phone]

## 2018-07-30 NOTE — Discharge Instructions (Addendum)
I have placed 2 stiches to your right hand please keep these clean and dry. Please have stiches removed within 7-10 days at your primary care office, Urgent care or ED. If you experience any fever, drainage from the wound or worsening pain please return to the ED.

## 2018-09-06 ENCOUNTER — Other Ambulatory Visit: Payer: Self-pay

## 2018-09-06 ENCOUNTER — Encounter (HOSPITAL_COMMUNITY): Payer: Self-pay | Admitting: *Deleted

## 2018-09-06 ENCOUNTER — Emergency Department (HOSPITAL_COMMUNITY)
Admission: EM | Admit: 2018-09-06 | Discharge: 2018-09-06 | Disposition: A | Discharge status: Home / Self Care | Payer: Self-pay | Attending: Emergency Medicine | Admitting: Emergency Medicine

## 2018-09-06 DIAGNOSIS — F17210 Nicotine dependence, cigarettes, uncomplicated: Secondary | ICD-10-CM | POA: Insufficient documentation

## 2018-09-06 DIAGNOSIS — K029 Dental caries, unspecified: Secondary | ICD-10-CM | POA: Insufficient documentation

## 2018-09-06 DIAGNOSIS — K0889 Other specified disorders of teeth and supporting structures: Secondary | ICD-10-CM | POA: Insufficient documentation

## 2018-09-06 DIAGNOSIS — Z79899 Other long term (current) drug therapy: Secondary | ICD-10-CM | POA: Insufficient documentation

## 2018-09-06 MED ORDER — METRONIDAZOLE 500 MG PO TABS: 500.0000 mg | ORAL_TABLET | Freq: Two times a day (BID) | ORAL | 0 refills | Status: DC

## 2018-09-06 MED ORDER — KETOROLAC TROMETHAMINE 30 MG/ML IJ SOLN
30.0000 mg | Freq: Once | INTRAMUSCULAR | Status: AC
  Administered 2018-09-06: 30 mg via INTRAMUSCULAR
  Filled 2018-09-06: qty 1

## 2018-09-06 MED ORDER — METRONIDAZOLE 500 MG PO TABS
500.0000 mg | ORAL_TABLET | Freq: Once | ORAL | Status: AC
Start: 2018-09-06 — End: 2018-09-06
  Administered 2018-09-06: 02:00:00 500 mg via ORAL
  Filled 2018-09-06: qty 1

## 2018-09-06 MED ORDER — NAPROXEN 500 MG PO TABS: 500.0000 mg | ORAL_TABLET | Freq: Two times a day (BID) | ORAL | 0 refills | Status: DC

## 2018-09-06 NOTE — Discharge Instructions (Addendum)
You were seen today for dental pain.  You will be started on an antibiotic.  Because of your allergies, you will be started on Flagyl.  It is very important that you follow-up with the dentist.  If you develop increasing pain, swelling, fevers, difficulty swallowing you need to be evaluated immediately.

## 2018-09-06 NOTE — ED Triage Notes (Signed)
Pt c/o left lower tooth pain x 2 weeks

## 2018-09-06 NOTE — ED Provider Notes (Signed)
[facility] Provider Note   CSN: [id number] Arrival date & time: 09/06/18  0046     History   Chief Complaint Chief Complaint  Patient presents with  . Dental Pain    HPI Miguel Robbins is a 38 y.o. male.     HPI  This is a 38 year old male who presents with dental pain.  Patient reports 2-week history of ongoing dental pain in the left lower jaw.  Patient states that usually when he gets dental pain it goes away in 3 to 4 days.  He states over the last 2 days it has gotten worse.  Denies any fever.  Denies any recent dental work.  Patient reports pain with swallowing but no difficulty swallowing.  He does not currently have a dentist.  Currently he rates his pain a 10 out of 10.  He took ibuprofen at midnight without relief.  Past Medical History:  Diagnosis Date  . Arm fracture, left   . Hernia   . Hyperlipidemia   . Migraine   . Trichimoniasis     Patient Active Problem List   Diagnosis Date Noted  . Calculus of gallbladder with acute cholecystitis 12/24/2015  . Acute calculous cholecystitis 12/24/2015    Past Surgical History:  Procedure Laterality Date  . CHOLECYSTECTOMY N/A 12/25/2015   Procedure: LAPAROSCOPIC CHOLECYSTECTOMY;  Surgeon: Ancil LinseyJason Evan Davis, MD;  Location: [facility];  Service: General;  Laterality: N/A;  . HERNIA REPAIR    . HERNIA REPAIR    . left arm fracture          Home Medications    Prior to Admission medications   Medication Sig Start Date End Date Taking? Authorizing Provider  atorvastatin (LIPITOR) 10 MG tablet Take 10 mg by mouth daily.    [provider]  metroNIDAZOLE (FLAGYL) 500 MG tablet Take 1 tablet (500 mg total) by mouth 2 (two) times daily. 09/06/18   Zandon Talton, Mayer Maskerourtney F, MD  naproxen (NAPROSYN) 500 MG tablet Take 1 tablet (500 mg total) by mouth 2 (two) times daily. 09/06/18   Nastasha Reising, Mayer Maskerourtney F, MD    Family History Family History  Problem Relation Age of Onset  . Hypertension Father   .  Diabetes Father   . Hypertension Sister   . Diabetes Sister   . Hypertension Mother   . Hypertension Brother     Social History Social History   Tobacco Use  . Smoking status: Current Every Day Smoker    Packs/day: 0.50    Years: 15.00    Pack years: 7.50    Types: Cigarettes  . Smokeless tobacco: Never Used  Substance Use Topics  . Alcohol use: Yes    Comment: 1/2 gallon weekly  . Drug use: No     Allergies   Penicillins, Bee venom, Clindamycin/lincomycin, and Cranberry   Review of Systems Review of Systems  Constitutional: Negative for fever.  HENT: Positive for dental problem. Negative for trouble swallowing.   All other systems reviewed and are negative.    Physical Exam Updated Vital Signs BP (!) 153/109 (BP Location: Left Arm)   Pulse (!) 53   Temp 98.2 F (36.8 C) (Oral)   Resp 18   Ht 1.702 m (5\' 7" )   Wt 99.3 kg   SpO2 99%   BMI 34.30 kg/m   Physical Exam Vitals signs and nursing note reviewed.  Constitutional:      Appearance: He is well-developed.  HENT:     Head: Normocephalic and  atraumatic.     Mouth/Throat:     Mouth: Mucous membranes are moist.     Comments: Generally poor dentition, multiple dental caries and missing teeth, tenderness to palpation along the jawline at the molars of the left lower jaw, no palpable abscess, no fullness under the tongue, no trismus Cardiovascular:     Rate and Rhythm: Normal rate and regular rhythm.  Pulmonary:     Effort: Pulmonary effort is normal. No respiratory distress.  Lymphadenopathy:     Cervical: Cervical adenopathy present.  Skin:    General: Skin is warm and dry.  Neurological:     Mental Status: He is alert and oriented to person, place, and time.  Psychiatric:        Mood and Affect: Mood normal.      ED Treatments / Results  Labs (all labs ordered are listed, but only abnormal results are displayed) Labs Reviewed - No data to display  EKG None  Radiology No results found.   Procedures Procedures (including critical care time)  Medications Ordered in ED Medications  ketorolac (TORADOL) 30 MG/ML injection 30 mg (has no administration in time range)     Initial Impression / Assessment and Plan / ED Course  I have reviewed the triage vital signs and the nursing notes.  Pertinent labs & imaging results that were available during my care of the patient were reviewed by me and considered in my medical decision making (see chart for details).        Patient presents with dental pain.  He is overall nontoxic-appearing vital signs are reassuring.  Suspect underlying infection.  He has generally poor dentition.  At this time have low suspicion for deep space infection.  Patient has allergies to both penicillin and clindamycin.  For this reason, will prescribe Flagyl.  Patient was given dental resources.  Recommend naproxen twice daily.  After history, exam, and medical workup I feel the patient has been appropriately medically screened and is safe for discharge home. Pertinent diagnoses were discussed with the patient. Patient was given return precautions.   Final Clinical Impressions(s) / ED Diagnoses   Final diagnoses:  Pain, dental    ED Discharge Orders         Ordered    naproxen (NAPROSYN) 500 MG tablet  2 times daily     09/06/18 0123    metroNIDAZOLE (FLAGYL) 500 MG tablet  2 times daily     09/06/18 0123           Lujuana Kapler, Barbette Hair, MD 09/06/18 [phone]

## 2019-03-07 ENCOUNTER — Encounter (HOSPITAL_COMMUNITY): Payer: Self-pay | Admitting: *Deleted

## 2019-03-07 ENCOUNTER — Emergency Department (HOSPITAL_COMMUNITY)
Admission: EM | Admit: 2019-03-07 | Discharge: 2019-03-07 | Disposition: A | Discharge status: Home / Self Care | Payer: Self-pay | Attending: Emergency Medicine | Admitting: Emergency Medicine

## 2019-03-07 ENCOUNTER — Other Ambulatory Visit: Payer: Self-pay

## 2019-03-07 DIAGNOSIS — M5441 Lumbago with sciatica, right side: Secondary | ICD-10-CM | POA: Insufficient documentation

## 2019-03-07 DIAGNOSIS — F17210 Nicotine dependence, cigarettes, uncomplicated: Secondary | ICD-10-CM | POA: Insufficient documentation

## 2019-03-07 DIAGNOSIS — L720 Epidermal cyst: Secondary | ICD-10-CM | POA: Insufficient documentation

## 2019-03-07 DIAGNOSIS — Z79899 Other long term (current) drug therapy: Secondary | ICD-10-CM | POA: Insufficient documentation

## 2019-03-07 MED ORDER — KETOROLAC TROMETHAMINE 60 MG/2ML IM SOLN
15.0000 mg | Freq: Once | INTRAMUSCULAR | Status: AC
  Administered 2019-03-07: 19:00:00 15 mg via INTRAMUSCULAR
  Filled 2019-03-07: qty 2

## 2019-03-07 MED ORDER — METHOCARBAMOL 500 MG PO TABS: 500.0000 mg | ORAL_TABLET | Freq: Every evening | ORAL | 0 refills | Status: DC | PRN

## 2019-03-07 MED ORDER — NAPROXEN 500 MG PO TABS: 500.0000 mg | ORAL_TABLET | Freq: Two times a day (BID) | ORAL | 0 refills | Status: DC

## 2019-03-07 NOTE — Discharge Instructions (Addendum)
Take naproxen 2 times a day with meals.  Do not take other anti-inflammatories at the same time (Advil, Motrin, ibuprofen, Aleve). You may supplement with Tylenol if you need further pain control. Use Robaxin as needed for muscle stiffness or soreness. Have caution, as this may make you tired or groggy. Do not drive or operate heavy machinery while taking this medication.  Use muscle creams (bengay, icy hot, salonpas) as needed for pain.  Follow up with your primary care doctor if pain is not improving with this treatment in 2 weeks.  Return to the ER if you develop high fevers, numbness, loss of bowel or bladder control, or any new or concerning symptoms.   The spot on your head is consistent with a cyst.  It does not appear infected at this time.  If it becomes painful, more swollen, or is draining persistent white fluid, follow-up with your primary care doctor for further management.

## 2019-03-07 NOTE — ED Provider Notes (Signed)
[facility] EMERGENCY DEPARTMENT Provider Note   CSN: [id number] Arrival date & time: 03/07/19  1656     History Chief Complaint  Patient presents with  . Back Pain    Miguel Robbins is a 39 y.o. male presenting for evaluation of back pain.  Patient states that when he woke up this morning around 3:00 in the morning he noticed right-sided back pain.  Pain is constant, radiates to his right leg.  He took Tylenol with mild improvement of symptoms, but has not taken anything else.  He denies fall, trauma, or injury.  He denies fevers, chills, nausea, vomiting, abdominal pain, loss of bladder control, numbness, tingling.  He reports previous history of sciatic problems.  He denies history of cancer or IVDU.  He has no other medical problems, takes no medications daily.  Additionally, patient requesting evaluation of spot on the occiput of his head.  He states it has been present for years, slightly bigger over the past several months.  It is nontender, occasionally drains but has not recently.  HPI     Past Medical History:  Diagnosis Date  . Arm fracture, left   . Hernia   . Hyperlipidemia   . Migraine   . Trichimoniasis     Patient Active Problem List   Diagnosis Date Noted  . Calculus of gallbladder with acute cholecystitis 12/24/2015  . Acute calculous cholecystitis 12/24/2015    Past Surgical History:  Procedure Laterality Date  . CHOLECYSTECTOMY N/A 12/25/2015   Procedure: LAPAROSCOPIC CHOLECYSTECTOMY;  Surgeon: Ancil Linsey, MD;  Location: [facility];  Service: General;  Laterality: N/A;  . HERNIA REPAIR    . HERNIA REPAIR    . left arm fracture         Family History  Problem Relation Age of Onset  . Hypertension Father   . Diabetes Father   . Hypertension Sister   . Diabetes Sister   . Hypertension Mother   . Hypertension Brother     Social History   Tobacco Use  . Smoking status: Current Every Day Smoker    Packs/day: 0.50    Years: 15.00   Pack years: 7.50    Types: Cigarettes  . Smokeless tobacco: Never Used  Substance Use Topics  . Alcohol use: Yes    Comment: once a week   . Drug use: No    Home Medications Prior to Admission medications   Medication Sig Start Date End Date Taking? Authorizing Provider  atorvastatin (LIPITOR) 10 MG tablet Take 10 mg by mouth daily.    [provider]  methocarbamol (ROBAXIN) 500 MG tablet Take 1 tablet (500 mg total) by mouth at bedtime as needed for muscle spasms. 03/07/19   Riyana Biel, PA-C  metroNIDAZOLE (FLAGYL) 500 MG tablet Take 1 tablet (500 mg total) by mouth 2 (two) times daily. 09/06/18   Horton, Mayer Masker, MD  naproxen (NAPROSYN) 500 MG tablet Take 1 tablet (500 mg total) by mouth 2 (two) times daily with a meal. 03/07/19   Erricka Falkner, PA-C    Allergies    Penicillins, Bee venom, Clindamycin/lincomycin, and Cranberry  Review of Systems   Review of Systems  Musculoskeletal: Positive for back pain.  Skin:       lesion on scalp  Neurological: Negative for numbness.    Physical Exam Updated Vital Signs BP 137/84 (BP Location: Right Arm)   Pulse 77   Temp (!) 97.5 F (36.4 C) (Oral)   Resp 14  Ht 5\' 7"  (1.702 m)   Wt 83.9 kg   SpO2 99%   BMI 28.98 kg/m   Physical Exam Vitals and nursing note reviewed.  Constitutional:      General: He is not in acute distress.    Appearance: He is well-developed.     Comments: Resting comfortably in the bed in no acute distress  HENT:     Head: Normocephalic and atraumatic.      Comments: Small, approximately 1 cm lesion to the scalp which is soft and fluctuant.  No active drainage.  No induration.  No erythema or tenderness. C/w cyst.  Eyes:     Extraocular Movements: Extraocular movements intact.  Cardiovascular:     Rate and Rhythm: Normal rate.  Pulmonary:     Effort: Pulmonary effort is normal.  Abdominal:     General: There is no distension.     Palpations: Abdomen is soft. There is no mass.       Tenderness: There is no abdominal tenderness. There is no guarding or rebound.     Comments: No tenderness palpation the abdomen  Musculoskeletal:        General: Tenderness present. Normal range of motion.     Cervical back: Normal range of motion and neck supple.     Comments: Tenderness palpation of the right low back musculature.  No pain over midline spine.  No step-offs or deformities.  Patient reports pain with straight leg raise, however no change in expression.  Strength of lower extremities equal bilaterally.  Pedal pulses 2+ bilaterally.  No saddle paresthesias.  No tenderness palpation of the left side back. patellar reflexes equal bilaterally.   Skin:    General: Skin is warm and dry.     Capillary Refill: Capillary refill takes less than 2 seconds.  Neurological:     Mental Status: He is alert and oriented to person, place, and time.     ED Results / Procedures / Treatments   Labs (all labs ordered are listed, but only abnormal results are displayed) Labs Reviewed - No data to display  EKG None  Radiology No results found.  Procedures Procedures (including critical care time)  Medications Ordered in ED Medications  ketorolac (TORADOL) injection 15 mg (has no administration in time range)    ED Course  I have reviewed the triage vital signs and the nursing notes.  Pertinent labs & imaging results that were available during my care of the patient were reviewed by me and considered in my medical decision making (see chart for details).    MDM Rules/Calculators/A&P                      Patient presenting for evaluation of low back pain.  Physical exam reassuring, neurovascularly intact.  No red flags for back pain.  Pain is reproducible with palpation of the musculature.  Likely musculoskeletal/sciatica.  Doubt fracture, I do not believe x-rays will be beneficial.  Doubt vertebral injury, infection, spinal cord compression, myelopathy, or cauda equina  syndrome.  Will treat symptomatically with NSAIDs, muscle relaxers, muscle creams.  Patient to follow-up with primary care as needed. Additionally, patient requesting evaluation for lesion on scalp.  Physical exam consistent with cyst which does not appear infected at this time.  Discussed benign etiology, and follow-up with primary care or dermatology as needed for further management.  At this time, patient appears safe for discharge.  Return precautions given.  Patient states he understands and agrees  to plan.  Final Clinical Impression(s) / ED Diagnoses Final diagnoses:  Acute right-sided low back pain with right-sided sciatica  Epidermoid cyst    Rx / DC Orders ED Discharge Orders         Ordered    naproxen (NAPROSYN) 500 MG tablet  2 times daily with meals     03/07/19 1822    methocarbamol (ROBAXIN) 500 MG tablet  At bedtime PRN     03/07/19 Orma Flaming, PA-C 03/07/19 1831    Linwood Dibbles, MD 03/08/19 1055

## 2019-03-07 NOTE — ED Triage Notes (Signed)
Pt with lower back pain that radiates down right leg since 0300 this morning.

## 2019-06-27 ENCOUNTER — Emergency Department (HOSPITAL_COMMUNITY)
Admission: EM | Admit: 2019-06-27 | Discharge: 2019-06-27 | Disposition: A | Discharge status: Home / Self Care | Payer: Self-pay | Attending: Emergency Medicine | Admitting: Emergency Medicine

## 2019-06-27 ENCOUNTER — Other Ambulatory Visit: Payer: Self-pay

## 2019-06-27 ENCOUNTER — Encounter (HOSPITAL_COMMUNITY): Payer: Self-pay | Admitting: Emergency Medicine

## 2019-06-27 DIAGNOSIS — Z79899 Other long term (current) drug therapy: Secondary | ICD-10-CM | POA: Insufficient documentation

## 2019-06-27 DIAGNOSIS — K047 Periapical abscess without sinus: Secondary | ICD-10-CM | POA: Insufficient documentation

## 2019-06-27 DIAGNOSIS — Z87891 Personal history of nicotine dependence: Secondary | ICD-10-CM | POA: Insufficient documentation

## 2019-06-27 MED ORDER — METRONIDAZOLE 500 MG PO TABS
500.0000 mg | ORAL_TABLET | Freq: Two times a day (BID) | ORAL | 0 refills | Status: DC
Start: 2019-06-27 — End: 2019-07-05

## 2019-06-27 MED ORDER — METRONIDAZOLE 500 MG PO TABS
500.0000 mg | ORAL_TABLET | Freq: Once | ORAL | Status: AC
  Administered 2019-06-27: 500 mg via ORAL
  Filled 2019-06-27: qty 1

## 2019-06-27 NOTE — ED Provider Notes (Signed)
[facility] EMERGENCY DEPARTMENT Provider Note   CSN: [id number] Arrival date & time: 06/27/19  7858     History Chief Complaint  Patient presents with  . Dental Pain    Miguel Robbins is a 39 y.o. male presenting with a 3 day history of dental pain in association with a chronically decayed left lower third molar tooth.  He has noted a small tender knot at his left jawline which started yesterday and is concerned about possible abscess formation.  He has been taken ibuprofen which does help with symptom relief but does cause some stomach upset.  He is also been using heat and ice, but stopped using the heat when he noted increased pain and swelling after use of this modality yesterday.  He denies fevers or chills and is able to swallow but has to maintain a soft diet at this time.  He has an appointment to see a dentist in mid July.  There is been no drainage from the site.  HPI     Past Medical History:  Diagnosis Date  . Arm fracture, left   . Hernia   . Hyperlipidemia   . Migraine   . Trichimoniasis     Patient Active Problem List   Diagnosis Date Noted  . Calculus of gallbladder with acute cholecystitis 12/24/2015  . Acute calculous cholecystitis 12/24/2015    Past Surgical History:  Procedure Laterality Date  . CHOLECYSTECTOMY N/A 12/25/2015   Procedure: LAPAROSCOPIC CHOLECYSTECTOMY;  Surgeon: Ancil Linsey, MD;  Location: [facility];  Service: General;  Laterality: N/A;  . HERNIA REPAIR    . HERNIA REPAIR    . left arm fracture         Family History  Problem Relation Age of Onset  . Hypertension Father   . Diabetes Father   . Hypertension Sister   . Diabetes Sister   . Hypertension Mother   . Hypertension Brother     Social History   Tobacco Use  . Smoking status: Former Smoker    Packs/day: 0.00    Years: 15.00    Pack years: 0.00    Types: Cigarettes    Quit date: 02/03/2019    Years since quitting: 0.3  . Smokeless tobacco: Never Used    Substance Use Topics  . Alcohol use: Yes    Comment: once a week   . Drug use: No    Home Medications Prior to Admission medications   Medication Sig Start Date End Date Taking? Authorizing Provider  atorvastatin (LIPITOR) 10 MG tablet Take 10 mg by mouth daily.    [provider]  methocarbamol (ROBAXIN) 500 MG tablet Take 1 tablet (500 mg total) by mouth at bedtime as needed for muscle spasms. 03/07/19   Caccavale, Sophia, PA-C  metroNIDAZOLE (FLAGYL) 500 MG tablet Take 1 tablet (500 mg total) by mouth 2 (two) times daily. 06/27/19   Burgess Amor, PA-C  naproxen (NAPROSYN) 500 MG tablet Take 1 tablet (500 mg total) by mouth 2 (two) times daily with a meal. 03/07/19   Caccavale, Sophia, PA-C    Allergies    Penicillins, Bee venom, Clindamycin/lincomycin, and Cranberry  Review of Systems   Review of Systems  Constitutional: Negative for chills and fever.  HENT: Positive for dental problem and facial swelling. Negative for sore throat, trouble swallowing and voice change.   Respiratory: Negative for shortness of breath.   Cardiovascular: Negative.   Gastrointestinal: Positive for nausea.  Musculoskeletal: Negative for neck pain and neck  stiffness.    Physical Exam Updated Vital Signs BP (!) 140/91 (BP Location: Right Arm)   Pulse 67   Temp 98.6 F (37 C) (Oral)   Resp 18   Ht 5\' 7"  (1.702 m)   Wt 83.9 kg   SpO2 99%   BMI 28.98 kg/m   Physical Exam Constitutional:      General: He is not in acute distress.    Appearance: He is well-developed.  HENT:     Head: Normocephalic and atraumatic.     Jaw: No trismus.     Right Ear: Tympanic membrane and external ear normal.     Left Ear: Tympanic membrane and external ear normal.     Mouth/Throat:     Mouth: No oral lesions.     Dentition: Dental tenderness, gingival swelling and dental caries present. No dental abscesses.     Palate: No mass.     Pharynx: Oropharynx is clear. Uvula midline. No pharyngeal swelling.      Comments: Normal phonation.  Sublingual space is soft.  There is a small induration at the left mandible.  No fluctuant abscess, no facial cellulitis.  Deep cavity noted at the left lower third molar with the proximal portion of the tooth completely missing.  There is no drainage from around the tooth, there is erythema without fluctuance with surrounding this tooth. Eyes:     Conjunctiva/sclera: Conjunctivae normal.  Cardiovascular:     Rate and Rhythm: Normal rate.     Heart sounds: Normal heart sounds.  Pulmonary:     Effort: Pulmonary effort is normal.  Musculoskeletal:        General: Normal range of motion.     Cervical back: Normal range of motion and neck supple.  Lymphadenopathy:     Head:     Left side of head: Submandibular adenopathy present.     Cervical: No cervical adenopathy.     Comments: Known neck or submandibular tenderness or edema.  Skin:    General: Skin is warm and dry.     Findings: No erythema.  Neurological:     Mental Status: He is alert and oriented to person, place, and time.     ED Results / Procedures / Treatments   Labs (all labs ordered are listed, but only abnormal results are displayed) Labs Reviewed - No data to display  EKG None  Radiology No results found.  Procedures Procedures (including critical care time)  Medications Ordered in ED Medications  metroNIDAZOLE (FLAGYL) tablet 500 mg (has no administration in time range)    ED Course  I have reviewed the triage vital signs and the nursing notes.  Pertinent labs & imaging results that were available during my care of the patient were reviewed by me and considered in my medical decision making (see chart for details).    MDM Rules/Calculators/A&P                      Patient with recurring infection in a chronic fractured and decayed left lower third molar tooth.  There is no evidence for a drainable abscess, no exam findings to suggest Ludwick's angina.  He was given strict  return precautions however if symptoms escalate.  He was placed on Flagyl since he is penicillin and clindamycin allergic.  Advised to continue ibuprofen but adding Pepcid to help with stomach upset and to make sure he takes the ibuprofen with food..  Follow-up anticipated. Final Clinical Impression(s) / ED Diagnoses Final  diagnoses:  Dental abscess    Rx / DC Orders ED Discharge Orders         Ordered    metroNIDAZOLE (FLAGYL) 500 MG tablet  2 times daily     06/27/19 0848           Evalee Jefferson, PA-C 06/27/19 3559    Elnora Morrison, MD 06/27/19 [phone]

## 2019-06-27 NOTE — Discharge Instructions (Addendum)
Complete your entire course of antibiotics as prescribed.  You  may continue taking the ibuprofen for pain relief, but make sure to take this with food to avoid stomach upset.  I also recommend taking a generic pepcid tablet ( famotidine) twice daily to help with any stomach upset.  Avoid applying heat or ice to this abscess area which can worsen your symptoms.  You may use warm salt water swish and spit treatment or half peroxide and water swish and spit after meals to keep this area clean as discussed.

## 2019-06-27 NOTE — ED Triage Notes (Signed)
PT c/o left lower dental pain x3 days with some facial swelling x1 day.

## 2019-07-05 ENCOUNTER — Encounter (HOSPITAL_COMMUNITY): Payer: Self-pay | Admitting: *Deleted

## 2019-07-05 ENCOUNTER — Encounter: Payer: Self-pay | Admitting: Emergency Medicine

## 2019-07-05 ENCOUNTER — Ambulatory Visit
Admission: EM | Admit: 2019-07-05 | Discharge: 2019-07-05 | Disposition: A | Discharge status: Home / Self Care | Payer: Self-pay

## 2019-07-05 ENCOUNTER — Emergency Department (HOSPITAL_COMMUNITY)
Admission: EM | Admit: 2019-07-05 | Discharge: 2019-07-05 | Disposition: A | Discharge status: Home / Self Care | Payer: Self-pay | Attending: Emergency Medicine | Admitting: Emergency Medicine

## 2019-07-05 ENCOUNTER — Other Ambulatory Visit: Payer: Self-pay

## 2019-07-05 DIAGNOSIS — K122 Cellulitis and abscess of mouth: Secondary | ICD-10-CM

## 2019-07-05 DIAGNOSIS — K047 Periapical abscess without sinus: Secondary | ICD-10-CM | POA: Insufficient documentation

## 2019-07-05 MED ORDER — CLINDAMYCIN HCL 300 MG PO CAPS: 300.0000 mg | ORAL_CAPSULE | Freq: Four times a day (QID) | ORAL | 0 refills | Status: AC

## 2019-07-05 NOTE — ED Provider Notes (Signed)
[facility] CARE CENTER   [id number] 07/05/19 Arrival Time: 1416  CC: DENTAL PAIN  SUBJECTIVE:  Miguel Robbins is a 39 y.o. male who presents with dental pain and oral swelling x > 1 week.  Denies a precipitating event or trauma.  Localizes pain to LT lower jaw.  Was seen at [facility] ED and treated with metronidazole without relief.  Worse with chewing.   Denies fever, chills, dysphagia, odynophagia, nausea, vomiting, chest pain, SOB.    ROS: As per HPI.  All other pertinent ROS negative.     Past Medical History:  Diagnosis Date   Arm fracture, left    Hernia    Hyperlipidemia    Migraine    Trichimoniasis    Past Surgical History:  Procedure Laterality Date   CHOLECYSTECTOMY N/A 12/25/2015   Procedure: LAPAROSCOPIC CHOLECYSTECTOMY;  Surgeon: Ancil Linsey, MD;  Location: [facility];  Service: General;  Laterality: N/A;   HERNIA REPAIR     HERNIA REPAIR     left arm fracture     Allergies  Allergen Reactions   Penicillins Anaphylaxis and Swelling    Has patient had a PCN reaction causing immediate rash, facial/tongue/throat swelling, SOB or lightheadedness with hypotension: Yes Has patient had a PCN reaction causing severe rash involving mucus membranes or skin necrosis: No Has patient had a PCN reaction that required hospitalization Yes Has patient had a PCN reaction occurring within the last 10 years: Yes If all of the above answers are "NO", then may proceed with Cephalosporin use.   Swelling of throat   Bee Venom Swelling   Clindamycin/Lincomycin Itching   Cranberry Itching   No current facility-administered medications on file prior to encounter.   Current Outpatient Medications on File Prior to Encounter  Medication Sig Dispense Refill   [DISCONTINUED] atorvastatin (LIPITOR) 10 MG tablet Take 10 mg by mouth daily.     Social History   Socioeconomic History   Marital status: Single    Spouse name: Not on file   Number of children: Not on file    Years of education: Not on file   Highest education level: Not on file  Occupational History    Employer: [employer]  Tobacco Use   Smoking status: Former Smoker    Packs/day: 0.00    Years: 15.00    Pack years: 0.00    Types: Cigarettes    Quit date: 02/03/2019    Years since quitting: 0.4   Smokeless tobacco: Never Used  Substance and Sexual Activity   Alcohol use: Yes    Comment: once a week    Drug use: No   Sexual activity: Yes    Birth control/protection: None  Other Topics Concern   Not on file  Social History Narrative   Not on file   Social Determinants of Health   Financial Resource Strain:    Difficulty of Paying Living Expenses:   Food Insecurity:    Worried About Programme researcher, broadcasting/film/video in the Last Year:    Barista in the Last Year:   Transportation Needs:    Freight forwarder (Medical):    Lack of Transportation (Non-Medical):   Physical Activity:    Days of Exercise per Week:    Minutes of Exercise per Session:   Stress:    Feeling of Stress :   Social Connections:    Frequency of Communication with Friends and Family:    Frequency of Social Gatherings with Friends and Family:  Attends Religious Services:    Active Member of Clubs or Organizations:    Attends Music therapist:    Marital Status:   Intimate Partner Violence:    Fear of Current or Ex-Partner:    Emotionally Abused:    Physically Abused:    Sexually Abused:    Family History  Problem Relation Age of Onset   Hypertension Father    Diabetes Father    Hypertension Sister    Diabetes Sister    Hypertension Mother    Hypertension Brother     OBJECTIVE:  Vitals:   07/05/19 1425 07/05/19 1427  BP:  128/80  Pulse:  61  Resp:  18  Temp:  98.9 F (37.2 C)  TempSrc:  Oral  SpO2:  95%  Weight: 185 lb (83.9 kg)   Height: 5\' 7"  (1.702 m)     General appearance: alert; no distress HENT: normocephalic, atraumatic; PERRL,  EOMI grossly; nares patent; RT cheek with swelling and 3 x 3 cm area of induration; dentition: poor; dental caries and absent tooth over bottom leg gum, with possible abscess of LT lower gum, TTP; tolerating own secretions Neck: supple without LAD CV: RRR Lungs: normal respirations; CTAB Skin: warm and dry Psychological: alert and cooperative; normal mood and affect  ASSESSMENT & PLAN:  1. Oral abscess      Recommending further evaluation and management in the ED for dental/ gum abscess.  Patient has failed course of metronidazole and continues to have swelling and pain.  Patient may be a candidate for dental/ gum abscess drainage in the ED where suctioning is available.  Patient is aware and in agreement with plan.  Will go by private vehicle to [facility] ED.     Lestine Box, PA-C 07/05/19 1441

## 2019-07-05 NOTE — Discharge Instructions (Signed)
Recommending further evaluation and management in the ED for dental/ gum abscess.  Patient has failed course of metronidazole and continues to have swelling and pain.  Patient may be a candidate for dental/ gum abscess drainage in the ED where suctioning is available.  Patient is aware and in agreement with plan.  Will go by private vehicle to [facility] ED.

## 2019-07-05 NOTE — ED Notes (Signed)
Patient is being discharged from the Urgent Care and sent to the Emergency Department via pov . Per , patient is in need of higher level of care due to dental abscess . Patient is aware and verbalizes understanding of plan of care.  Vitals:   07/05/19 1427  BP: 128/80  Pulse: 61  Resp: 18  Temp: 98.9 F (37.2 C)  SpO2: 95%

## 2019-07-05 NOTE — ED Triage Notes (Signed)
Pt c/o left sided facial swelling and dental pain x 1 week. Pt was seen here 1 week ago and told he had a swollen lymph node and was placed on Flagyl. Pt reports the swelling and pain has only worsened since last week. Pt went to Urgent Care today and was told to come to the ED for drainage of a dental abscess. Denies fever.

## 2019-07-05 NOTE — Discharge Instructions (Signed)
You were given a prescription for antibiotics. Please take the antibiotic prescription fully.   Please follow-up with a dentist in the next 5 to 7 days for reevaluation.  If you do not have a dentist, resources were provided for dentist in the area in your discharge summary.  Please contact one of the offices that are listed and make an appointment for follow-up.  Please return to the emergency department for any new or worsening symptoms.  

## 2019-07-05 NOTE — ED Triage Notes (Signed)
Pt seen at [facility] 05/25 given flagyl for dental swelling to LT side of face.  Pt completed abx with no change in the swelling or pain.

## 2019-07-05 NOTE — ED Provider Notes (Signed)
[facility] EMERGENCY DEPARTMENT Provider Note   CSN: [id number] Arrival date & time: 07/05/19  1447     History Chief Complaint  Patient presents with   Facial Swelling    Miguel Robbins is a 39 y.o. male.  HPI   39 year old male presenting for evaluation of left dental pain.  States that he was seen in the ED 1 week ago with left dental pain and was placed on Flagyl.  He has continued to have worsening pain and swelling to the left side of the face.  He denies any fevers or systemic symptoms at home.  He was seen at urgent care prior to arrival and sent here for further evaluation and I&D.  Past Medical History:  Diagnosis Date   Arm fracture, left    Hernia    Hyperlipidemia    Migraine    Trichimoniasis     Patient Active Problem List   Diagnosis Date Noted   Calculus of gallbladder with acute cholecystitis 12/24/2015   Acute calculous cholecystitis 12/24/2015    Past Surgical History:  Procedure Laterality Date   CHOLECYSTECTOMY N/A 12/25/2015   Procedure: LAPAROSCOPIC CHOLECYSTECTOMY;  Surgeon: Vickie Epley, MD;  Location: [facility];  Service: General;  Laterality: N/A;   [place]     left arm fracture         Family History  Problem Relation Age of Onset   Hypertension Father    Diabetes Father    Hypertension Sister    Diabetes Sister    Hypertension Mother    Hypertension Brother     Social History   Tobacco Use   Smoking status: Former Smoker    Packs/day: 0.00    Years: 15.00    Pack years: 0.00    Types: Cigarettes    Quit date: 02/03/2019    Years since quitting: 0.4   Smokeless tobacco: Never Used  Substance Use Topics   Alcohol use: Yes    Comment: once a week    Drug use: No    Home Medications Prior to Admission medications   Medication Sig Start Date End Date Taking? Authorizing Provider  ibuprofen (ADVIL) 200 MG tablet Take 200 mg by mouth at bedtime as needed and may repeat  dose one time if needed for mild pain or moderate pain.   Yes [provider]  metroNIDAZOLE (FLAGYL) 500 MG tablet Take 500 mg by mouth in the morning and at bedtime. 7 day course starting on 06/27/2019   Yes [provider]  clindamycin (CLEOCIN) 300 MG capsule Take 1 capsule (300 mg total) by mouth every 6 (six) hours for 7 days. 07/05/19 07/12/19  Keylee Shrestha S, PA-C  atorvastatin (LIPITOR) 10 MG tablet Take 10 mg by mouth daily.  07/05/19  [provider]    Allergies    Penicillins, Bee venom, Clindamycin/lincomycin, and Cranberry  Review of Systems   Review of Systems  Constitutional: Negative for fever.  HENT: Positive for dental problem and facial swelling. Negative for trouble swallowing.   Gastrointestinal: Negative for nausea and vomiting.    Physical Exam Updated Vital Signs BP (!) 145/90    Pulse (!) 59    Temp 99.5 F (37.5 C) (Oral)    Resp 13    Ht 5\' 7"  (1.702 m)    Wt 83.9 kg    SpO2 100%    BMI 28.98 kg/m   Physical Exam Constitutional:      General:  He is not in acute distress.    Appearance: He is well-developed.  HENT:     Mouth/Throat:     Comments: Multiple dental caries and fractured teeth.  There is a large area of fluctuance to the gumline adjacent to the left lower molars that is tender to palpation.  There is no trismus.  No sublingual or submandibular swelling.  Normal phonation. Eyes:     Conjunctiva/sclera: Conjunctivae normal.  Cardiovascular:     Rate and Rhythm: Normal rate.  Pulmonary:     Effort: Pulmonary effort is normal.  Skin:    General: Skin is warm and dry.  Neurological:     Mental Status: He is alert and oriented to person, place, and time.     ED Results / Procedures / Treatments   Labs (all labs ordered are listed, but only abnormal results are displayed) Labs Reviewed - No data to display  EKG None  Radiology No results found.  Procedures Procedures (including critical care  time)  Medications Ordered in ED Medications - No data to display  ED Course  I have reviewed the triage vital signs and the nursing notes.  Pertinent labs & imaging results that were available during my care of the patient were reviewed by me and considered in my medical decision making (see chart for details).    MDM Rules/Calculators/A&P                      Patient with dental abscess amenable to incision and drainage.  No evidence of Ludwick's angina or other deep space infection on my evaluation.  Patient felt improvement after I&D.  Will DC home with clindamycin.  Given information to follow-up with a dentist.  Advised on return precautions.  He voices understanding of the plan and reasons to return.  All questions answered.  Patient stable for discharge.     Final Clinical Impression(s) / ED Diagnoses Final diagnoses:  Dental abscess    Rx / DC Orders ED Discharge Orders         Ordered    clindamycin (CLEOCIN) 300 MG capsule  Every 6 hours     07/05/19 1859           Karrie Meres, PA-C 07/05/19 1902    Pollyann Savoy, MD 07/05/19 [phone]

## 2019-09-15 ENCOUNTER — Other Ambulatory Visit: Payer: Self-pay

## 2019-09-15 ENCOUNTER — Emergency Department (HOSPITAL_COMMUNITY): Admission: EM | Admit: 2019-09-15 | Discharge: 2019-09-15 | Discharge status: Against Medical Advice | Payer: Self-pay

## 2019-09-15 NOTE — ED Notes (Signed)
Per registration pt left the facility 

## 2019-10-11 IMAGING — CT CT RENAL STONE PROTOCOL
2 of 4 series · 16 of 46 positions shown, 18 images · non-contrast
Comparison: CT scan dated 08/28/2016

CLINICAL DATA: Hematuria.  Intermittent left flank pain.

EXAM:
CT ABDOMEN AND PELVIS WITHOUT CONTRAST
TECHNIQUE: Multidetector CT imaging of the abdomen and pelvis was performed
following the standard protocol without IV contrast.

[Series 2: axial st · axial · 0.69mm/px · z∈[+532,+967]mm · 13 of 99 slices shown, 15 images]
[im 6/99  soft-tissue]
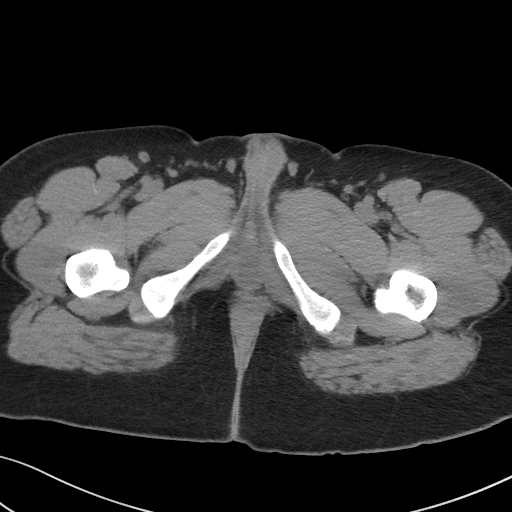
[im 6/99  bone]
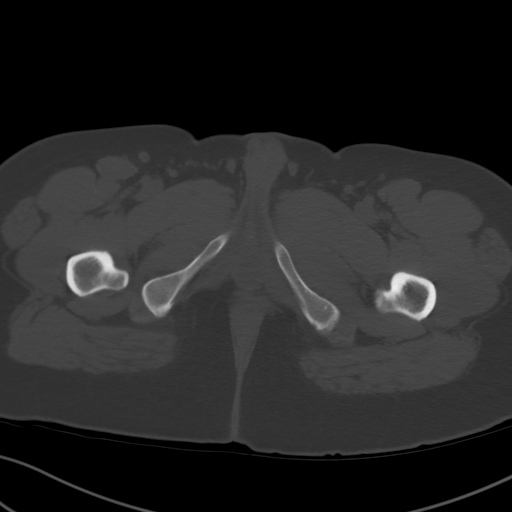
[im 12/99  soft-tissue]
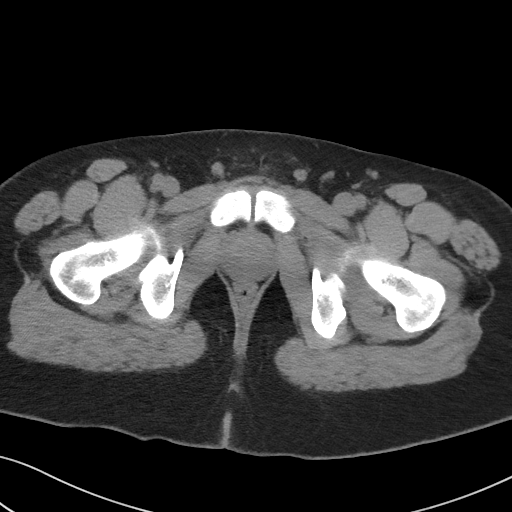
[im 24/99  soft-tissue]
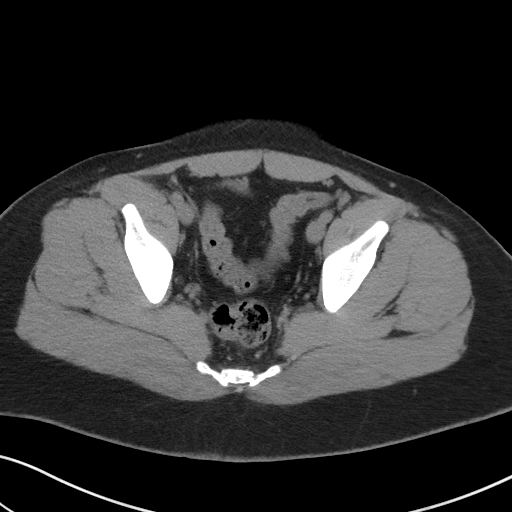
[im 29/99  soft-tissue]
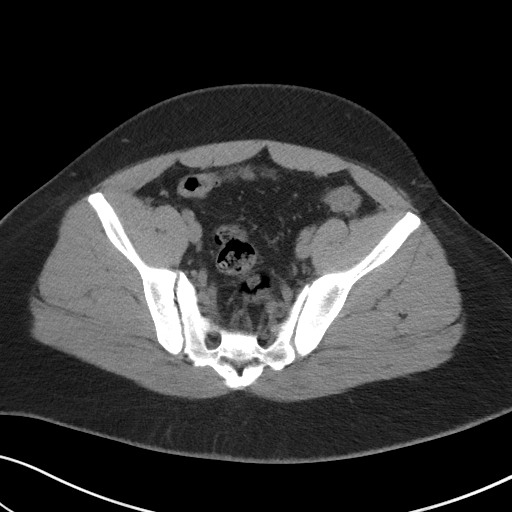
[im 35/99  soft-tissue]
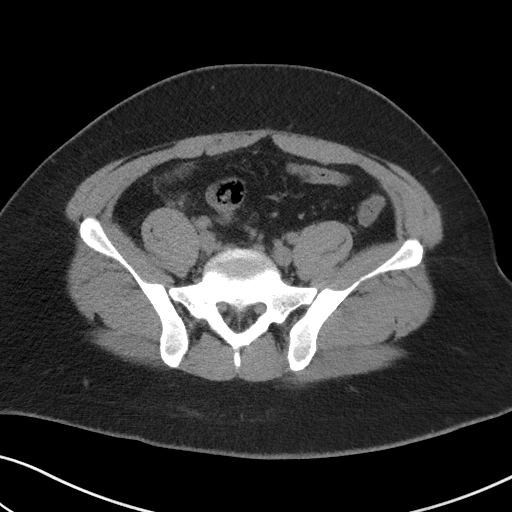
[im 41/99  soft-tissue]
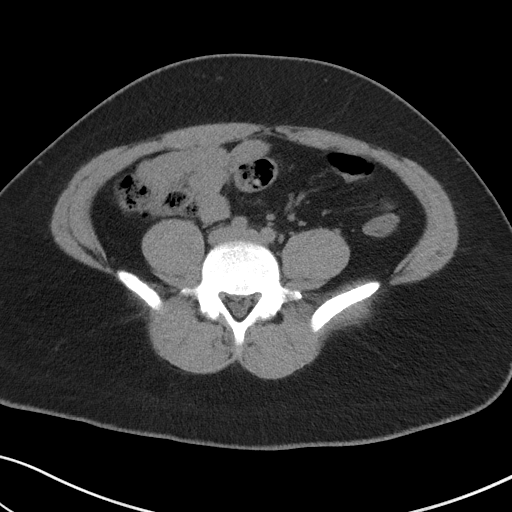
[im 52/99  soft-tissue]
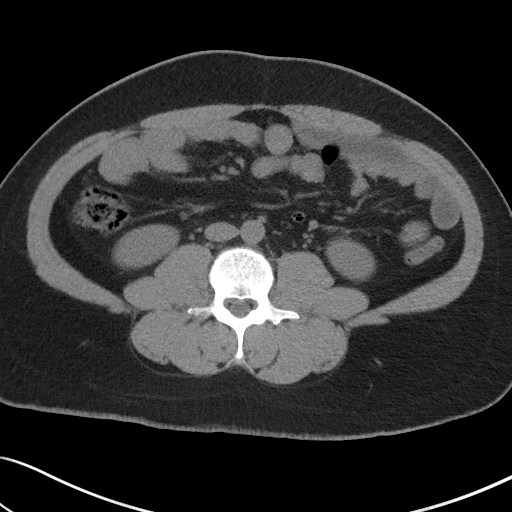
[im 58/99  soft-tissue]
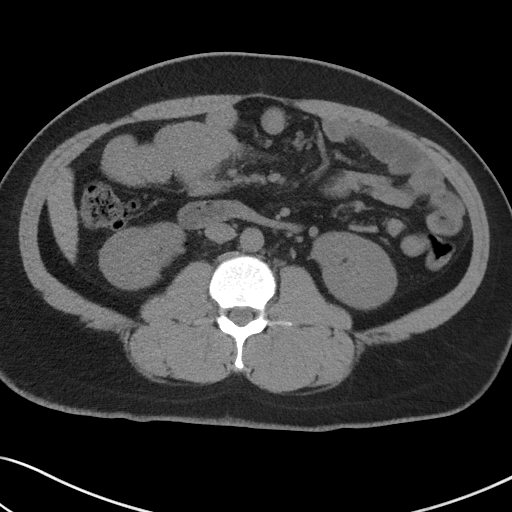
[im 64/99  soft-tissue]
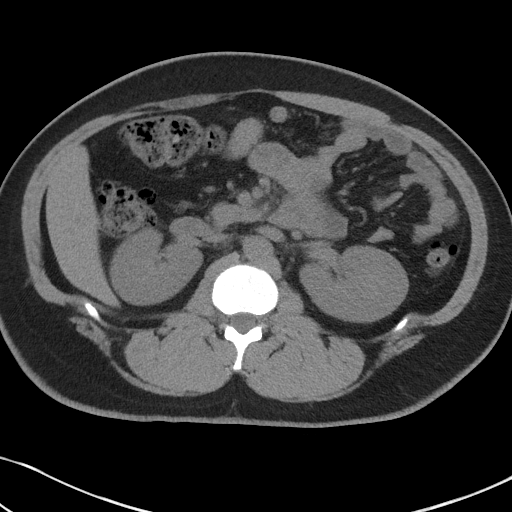
[im 64/99  bone]
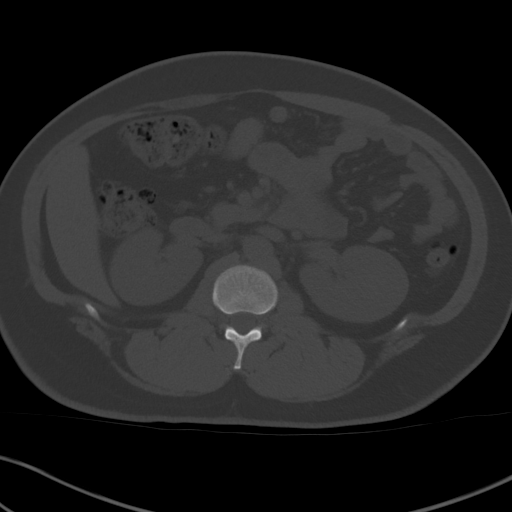
[im 70/99  soft-tissue]
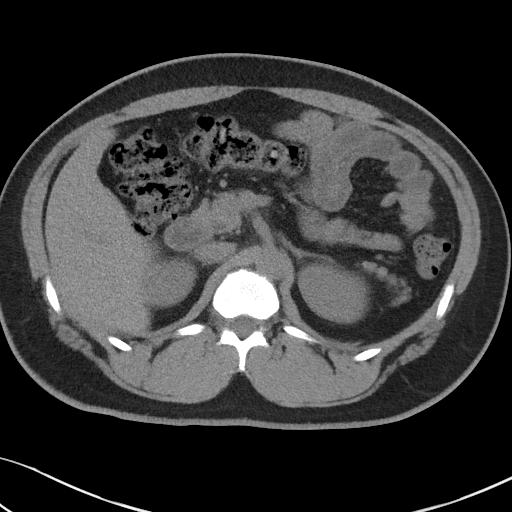
[im 75/99  soft-tissue]
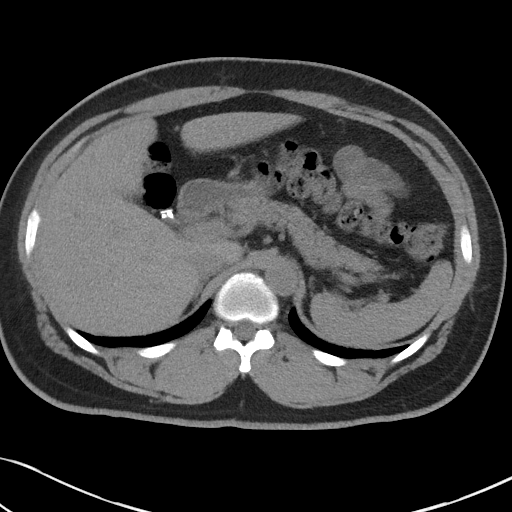
[im 87/99  soft-tissue]
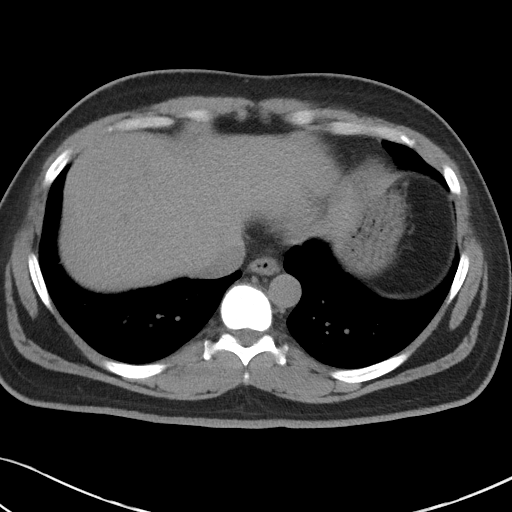
[im 93/99  soft-tissue]
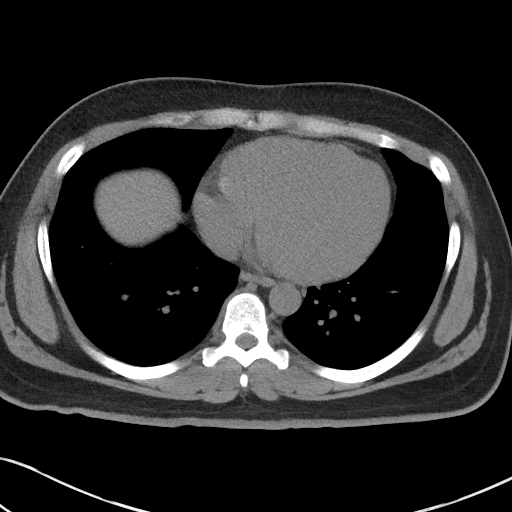

[Series 5: coronal st · coronal · 0.73mm/px · 3 of 82 slices shown]
[im 28/82  soft-tissue]
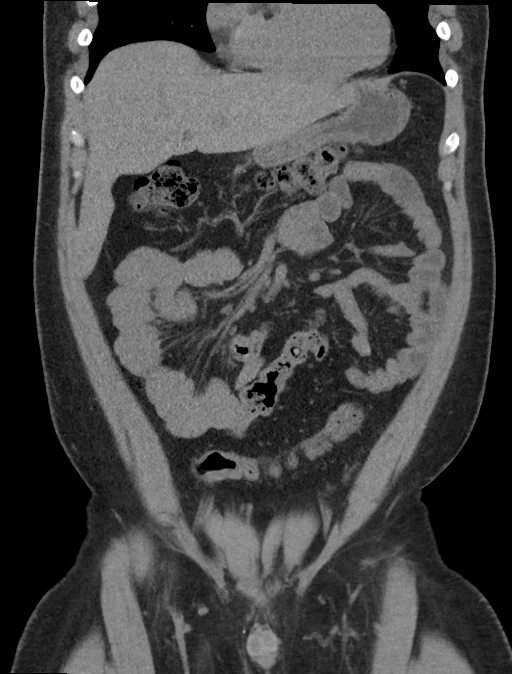
[im 37/82  soft-tissue]
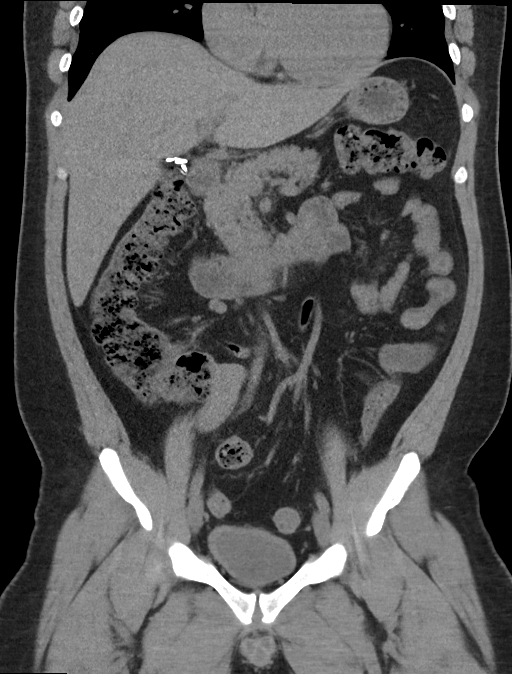
[im 46/82  soft-tissue]
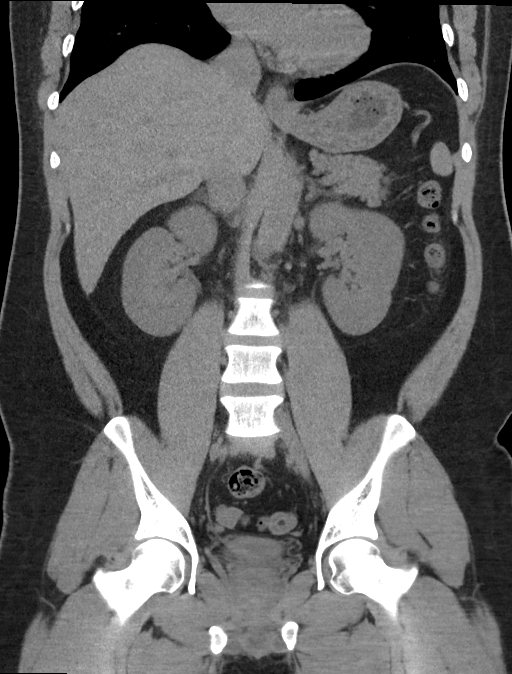

[16 of 46 positions shown; findings below may reference images not displayed]

FINDINGS: Lower chest: Normal.

Hepatobiliary: There is a tiny focal area of fatty infiltration in
the left lobe adjacent to the falciform ligament. Hepatic parenchyma
is otherwise normal. Cholecystectomy. No dilated bile ducts.

Pancreas: Unremarkable. No pancreatic ductal dilatation or
surrounding inflammatory changes.

Spleen: Normal in size without focal abnormality.

Adrenals/Urinary Tract: Adrenal glands are unremarkable. Kidneys are
normal, without renal calculi, focal lesion, or hydronephrosis.
Bladder is unremarkable. Small phlebolith in the right side of the
pelvis is immediately adjacent to the normal appearing right ureter
and is unchanged.

Stomach/Bowel: Stomach is within normal limits. Appendix appears
normal but extends across the midline into the left mid abdomen. No
evidence of bowel wall thickening, distention, or inflammatory
changes.

Vascular/Lymphatic: No significant vascular findings are present. No
enlarged abdominal or pelvic lymph nodes.

Reproductive: Prostate is unremarkable.

Other: No abdominal wall hernia or abnormality. No abdominopelvic
ascites.

Musculoskeletal: No acute or significant osseous findings.
IMPRESSION: Benign-appearing abdomen and pelvis.

## 2019-10-27 ENCOUNTER — Other Ambulatory Visit: Payer: Self-pay

## 2019-10-27 ENCOUNTER — Encounter (HOSPITAL_COMMUNITY): Payer: Self-pay

## 2019-10-27 ENCOUNTER — Emergency Department (HOSPITAL_COMMUNITY)
Admission: EM | Admit: 2019-10-27 | Discharge: 2019-10-27 | Disposition: A | Discharge status: Home / Self Care | Payer: Self-pay | Attending: Emergency Medicine | Admitting: Emergency Medicine

## 2019-10-27 DIAGNOSIS — R220 Localized swelling, mass and lump, head: Secondary | ICD-10-CM | POA: Insufficient documentation

## 2019-10-27 DIAGNOSIS — Z87891 Personal history of nicotine dependence: Secondary | ICD-10-CM | POA: Insufficient documentation

## 2019-10-27 DIAGNOSIS — K047 Periapical abscess without sinus: Secondary | ICD-10-CM

## 2019-10-27 DIAGNOSIS — K0889 Other specified disorders of teeth and supporting structures: Secondary | ICD-10-CM | POA: Insufficient documentation

## 2019-10-27 MED ORDER — CLINDAMYCIN HCL 300 MG PO CAPS
300.0000 mg | ORAL_CAPSULE | Freq: Four times a day (QID) | ORAL | 0 refills | Status: AC
Start: 2019-10-27 — End: 2019-11-06

## 2019-10-27 MED ORDER — IBUPROFEN 600 MG PO TABS
600.0000 mg | ORAL_TABLET | Freq: Four times a day (QID) | ORAL | 0 refills | Status: DC | PRN
Start: 2019-10-27 — End: 2021-07-30

## 2019-10-27 NOTE — ED Notes (Signed)
Reports pain/swelling for the last 3 days from dental; problems   Here for eval

## 2019-10-27 NOTE — ED Provider Notes (Signed)
[facility] EMERGENCY DEPARTMENT Provider Note   CSN: [id number] Arrival date & time: 10/27/19  1019     History Chief Complaint  Patient presents with  . Dental Pain    Miguel Robbins is a 39 y.o. male who presents with left lower dental pain x 3 days. Patient states he had a crown on that tooth that came off last week. He has had progressively worsening aching pain and swelling of the left cheek x 3 days. Unable to chew on that side.  Denies pain or swelling beneath tongue or below chin. Denies difficulty swallowing, denies difficulty breathing. Denies fever or chills.   He has a history of infection of the same tooth in 07/2019, treated with clindamycin with resolution of infection. History of multiple caries and poor dental hygiene. He states that he has a dental appointment for 12/2019.   HPI     Past Medical History:  Diagnosis Date  . Arm fracture, left   . Hernia   . Hyperlipidemia   . Migraine   . Trichimoniasis     Patient Active Problem List   Diagnosis Date Noted  . Calculus of gallbladder with acute cholecystitis 12/24/2015  . Acute calculous cholecystitis 12/24/2015    Past Surgical History:  Procedure Laterality Date  . CHOLECYSTECTOMY N/A 12/25/2015   Procedure: LAPAROSCOPIC CHOLECYSTECTOMY;  Surgeon: Ancil Linsey, MD;  Location: [facility];  Service: General;  Laterality: N/A;  . HERNIA REPAIR    . HERNIA REPAIR    . left arm fracture         Family History  Problem Relation Age of Onset  . Hypertension Father   . Diabetes Father   . Hypertension Sister   . Diabetes Sister   . Hypertension Mother   . Hypertension Brother     Social History   Tobacco Use  . Smoking status: Former Smoker    Packs/day: 0.00    Years: 15.00    Pack years: 0.00    Types: Cigarettes    Quit date: 02/03/2019    Years since quitting: 0.7  . Smokeless tobacco: Never Used  Vaping Use  . Vaping Use: Never used  Substance Use Topics  . Alcohol use: Yes     Comment: once a week   . Drug use: No    Home Medications Prior to Admission medications   Medication Sig Start Date End Date Taking? Authorizing Provider  ibuprofen (ADVIL) 200 MG tablet Take 200 mg by mouth at bedtime as needed and may repeat dose one time if needed for mild pain or moderate pain.    [provider]  metroNIDAZOLE (FLAGYL) 500 MG tablet Take 500 mg by mouth in the morning and at bedtime. 7 day course starting on 06/27/2019    [provider]  atorvastatin (LIPITOR) 10 MG tablet Take 10 mg by mouth daily.  07/05/19  [provider]    Allergies    Penicillins, Bee venom, Clindamycin/lincomycin, and Cranberry  Review of Systems   Review of Systems  Constitutional: Negative for chills, fatigue and fever.  HENT: Positive for dental problem and facial swelling. Negative for mouth sores, sore throat, trouble swallowing and voice change.   Respiratory: Negative for chest tightness and shortness of breath.   Cardiovascular: Negative for chest pain.  Gastrointestinal: Negative for abdominal pain, nausea and vomiting.  Musculoskeletal: Negative for neck pain.  Neurological: Negative for headaches.    Physical Exam Updated Vital Signs BP 124/86 (BP Location: Right Arm)  Pulse 81   Temp 99.3 F (37.4 C) (Oral)   Resp 16   Ht 5\' 7"  (1.702 m)   Wt 79.4 kg   SpO2 99%   BMI 27.41 kg/m   Physical Exam Vitals and nursing note reviewed.  HENT:     Head: Normocephalic and atraumatic.     Mouth/Throat:     Mouth: Mucous membranes are moist.     Pharynx: No oropharyngeal exudate or posterior oropharyngeal erythema.      Comments: Multiple dental caries and fractured teeth; poor dental hygiene.  No trismus Normal phonation No sublingal or submental tenderness to palpation or edema Eyes:     General:        Right eye: No discharge.        Left eye: No discharge.     Conjunctiva/sclera: Conjunctivae normal.  Cardiovascular:     Rate and  Rhythm: Normal rate and regular rhythm.     Heart sounds: Normal heart sounds.  Pulmonary:     Effort: Pulmonary effort is normal. No respiratory distress.     Breath sounds: Normal breath sounds. No wheezing or rales.  Musculoskeletal:        General: No deformity.     Cervical back: Neck supple.  Skin:    General: Skin is warm and dry.  Neurological:     Mental Status: He is alert. Mental status is at baseline.  Psychiatric:        Mood and Affect: Mood normal.     ED Results / Procedures / Treatments   Labs (all labs ordered are listed, but only abnormal results are displayed) Labs Reviewed - No data to display  EKG None  Radiology No results found.  Procedures Procedures (including critical care time)  Medications Ordered in ED Medications - No data to display  ED Course  I have reviewed the triage vital signs and the nursing notes.  Pertinent labs & imaging results that were available during my care of the patient were reviewed by me and considered in my medical decision making (see chart for details).    MDM Rules/Calculators/A&P                         Patient with history of dental infections, fractured teeth, poor dental hygiene.    Vital signs normal; afebrile at home  DDX for dental pain includes but is not limited to dental caries, periapical abscess, periodontal abscess, retropharyngeal or peritonsillar abscess, ludwig's angina.   Physical exam reassuring without TTP or edema sublingually or submentally, unlikely Ludgwig's angina. No obvious collection of fluid or area of fluctuance amenable to incision and drainage.   Will prescribe Clindamycin 300 mg PO QID x 10 days for apparent apical infection near fractured tooth.  Recommend the patient call his dentist and request a sooner appointment, for 10 days from now, upon completion of antibiotics.   Will also prescribe ibuprofen 600 mg for pain management. The patient may also utilize ice externally  for symptom relief.   The patient should return to the ED should he develop swelling or tenderness sublingually or submentally, or if he should develop difficulty breathing, swallowing, fever or chills.   Mr. voiced understanding of the plan and each of his questions were answered to his expressed satisfaction.   Final Clinical Impression(s) / ED Diagnoses Final diagnoses:  None    Rx / DC Orders ED Discharge Orders    None  Sherrilee Gilles 10/27/19 1317    Eber Hong, MD 10/28/19 [phone]

## 2019-10-27 NOTE — Discharge Instructions (Signed)
You have been prescribed an antibiotic for an infection around your tooth. You should complete all 10 days of the antibiotic.  You have also been prescribed ibuprofen for pain management; you may alternate this with tylenol for your pain.   Please call your dentist and try to move your appointment up to 10 days from now.  Please return to the emergency department if you develop any swelling or tenderness under your tongue or chin, or if you develop difficulty swallowing or breathing.

## 2019-10-27 NOTE — ED Provider Notes (Signed)
Medical screening examination/treatment/procedure(s) were conducted as a shared visit with non-physician practitioner(s) and myself.  I personally evaluated the patient during the encounter.  Clinical Impression:   Final diagnoses:  Dental abscess    This patient has had recurrent dental infections, at this time the patient has pain and swelling around the left lower jaw, the first molar on the left appears intact, the second molar is completely degraded down to the gumline with associated mild swelling of the jaw, there is no trismus or torticollis, no sublingual pain or swelling, phonation is normal, mild lymphadenopathy.  There is no palpable area of fluctuance.  Antibiotics, follow-up with dentist, he has an appointment for November, he will call to arrange faster follow-up.  Stable for discharge   Eber Hong, MD 10/28/19 [phone]

## 2019-10-27 NOTE — ED Triage Notes (Signed)
Pt reports swelling left side of jaw from lower tooth x 3 days.

## 2019-11-26 ENCOUNTER — Other Ambulatory Visit: Payer: Self-pay

## 2019-11-26 ENCOUNTER — Encounter (HOSPITAL_COMMUNITY): Payer: Self-pay | Admitting: Emergency Medicine

## 2019-11-26 ENCOUNTER — Emergency Department (HOSPITAL_COMMUNITY)
Admission: EM | Admit: 2019-11-26 | Discharge: 2019-11-26 | Disposition: A | Discharge status: Home / Self Care | Payer: Self-pay | Attending: Emergency Medicine | Admitting: Emergency Medicine

## 2019-11-26 DIAGNOSIS — K2920 Alcoholic gastritis without bleeding: Secondary | ICD-10-CM | POA: Insufficient documentation

## 2019-11-26 DIAGNOSIS — Z87891 Personal history of nicotine dependence: Secondary | ICD-10-CM | POA: Insufficient documentation

## 2019-11-26 DIAGNOSIS — E860 Dehydration: Secondary | ICD-10-CM | POA: Insufficient documentation

## 2019-11-26 DIAGNOSIS — R112 Nausea with vomiting, unspecified: Secondary | ICD-10-CM

## 2019-11-26 LAB — CBC WITH DIFFERENTIAL/PLATELET
Abs Immature Granulocytes: 0.05 10*3/uL (ref 0.00–0.07)
Basophils Absolute: 0.1 10*3/uL (ref 0.0–0.1)
Basophils Relative: 1 %
Eosinophils Absolute: 0 10*3/uL (ref 0.0–0.5)
Eosinophils Relative: 0 %
HCT: 46 % (ref 39.0–52.0)
Hemoglobin: 15.3 g/dL (ref 13.0–17.0)
Immature Granulocytes: 1 %
Lymphocytes Relative: 21 %
Lymphs Abs: 1.6 10*3/uL (ref 0.7–4.0)
MCH: 31.7 pg (ref 26.0–34.0)
MCHC: 33.3 g/dL (ref 30.0–36.0)
MCV: 95.4 fL (ref 80.0–100.0)
Monocytes Absolute: 0.4 10*3/uL (ref 0.1–1.0)
Monocytes Relative: 5 %
Neutro Abs: 5.5 10*3/uL (ref 1.7–7.7)
Neutrophils Relative %: 72 %
Platelets: 329 10*3/uL (ref 150–400)
RBC: 4.82 MIL/uL (ref 4.22–5.81)
RDW: 14.2 % (ref 11.5–15.5)
WBC: 7.6 10*3/uL (ref 4.0–10.5)
nRBC: 0 % (ref 0.0–0.2)

## 2019-11-26 LAB — COMPREHENSIVE METABOLIC PANEL
ALT: 34 U/L (ref 0–44)
AST: 28 U/L (ref 15–41)
Albumin: 4.9 g/dL (ref 3.5–5.0)
Alkaline Phosphatase: 53 U/L (ref 38–126)
Anion gap: 11 (ref 5–15)
BUN: 9 mg/dL (ref 6–20)
CO2: 25 mmol/L (ref 22–32)
Calcium: 9.5 mg/dL (ref 8.9–10.3)
Chloride: 104 mmol/L (ref 98–111)
Creatinine, Ser: 0.75 mg/dL (ref 0.61–1.24)
GFR, Estimated: >60 mL/min (ref >60)
Glucose, Bld: 102 mg/dL — ABNORMAL HIGH (ref 70–99)
Potassium: 3.5 mmol/L (ref 3.5–5.1)
Sodium: 140 mmol/L (ref 135–145)
Total Bilirubin: 0.9 mg/dL (ref 0.3–1.2)
Total Protein: 8.5 g/dL — ABNORMAL HIGH (ref 6.5–8.1)

## 2019-11-26 LAB — ETHANOL: Alcohol, Ethyl (B): <10 mg/dL (ref <10)

## 2019-11-26 LAB — LIPASE, BLOOD: Lipase: 29 U/L (ref 11–51)

## 2019-11-26 MED ORDER — SODIUM CHLORIDE 0.9 % IV BOLUS
1000.0000 mL | Freq: Once | INTRAVENOUS | Status: AC
  Administered 2019-11-26: 1000 mL via INTRAVENOUS

## 2019-11-26 MED ORDER — ONDANSETRON 4 MG PO TBDP
4.0000 mg | ORAL_TABLET | Freq: Three times a day (TID) | ORAL | 0 refills | Status: DC | PRN
Start: 2019-11-26 — End: 2021-07-30

## 2019-11-26 MED ORDER — ONDANSETRON HCL 4 MG/2ML IJ SOLN
4.0000 mg | Freq: Once | INTRAMUSCULAR | Status: AC
  Administered 2019-11-26: 4 mg via INTRAVENOUS
  Filled 2019-11-26: qty 2

## 2019-11-26 NOTE — ED Provider Notes (Signed)
[facility] EMERGENCY DEPARTMENT Provider Note   CSN: [id number] Arrival date & time: 11/26/19  1853     History Chief Complaint  Patient presents with  . Emesis    Miguel Robbins is a 39 y.o. male.  Pt presents to the ED today with n/v.  Pt said he drank at least a pint of alcohol by himself last night.  He also took 1 ecstasy pill around 2300 yesterday.  He said he has been vomiting all day.  He also feels like his heart starts racing when he moves.         Past Medical History:  Diagnosis Date  . Arm fracture, left   . Hernia   . Hyperlipidemia   . Migraine   . Trichimoniasis     Patient Active Problem List   Diagnosis Date Noted  . Calculus of gallbladder with acute cholecystitis 12/24/2015  . Acute calculous cholecystitis 12/24/2015    Past Surgical History:  Procedure Laterality Date  . CHOLECYSTECTOMY N/A 12/25/2015   Procedure: LAPAROSCOPIC CHOLECYSTECTOMY;  Surgeon: Ancil Linsey, MD;  Location: [facility];  Service: General;  Laterality: N/A;  . HERNIA REPAIR    . HERNIA REPAIR    . left arm fracture         Family History  Problem Relation Age of Onset  . Hypertension Father   . Diabetes Father   . Hypertension Sister   . Diabetes Sister   . Hypertension Mother   . Hypertension Brother     Social History   Tobacco Use  . Smoking status: Former Smoker    Packs/day: 0.00    Years: 15.00    Pack years: 0.00    Types: Cigarettes    Quit date: 02/03/2019    Years since quitting: 0.8  . Smokeless tobacco: Never Used  Vaping Use  . Vaping Use: Never used  Substance Use Topics  . Alcohol use: Yes    Comment: once a week   . Drug use: Yes    Comment: ecstasy    Home Medications Prior to Admission medications   Medication Sig Start Date End Date Taking? Authorizing Provider  ibuprofen (ADVIL) 600 MG tablet Take 1 tablet (600 mg total) by mouth every 6 (six) hours as needed for moderate pain. 10/27/19   Sponseller, Lupe Carney R, PA-C    metroNIDAZOLE (FLAGYL) 500 MG tablet Take 500 mg by mouth in the morning and at bedtime. 7 day course starting on 06/27/2019    [provider]  ondansetron (ZOFRAN ODT) 4 MG disintegrating tablet Take 1 tablet (4 mg total) by mouth every 8 (eight) hours as needed for nausea or vomiting. 11/26/19   Jacalyn Lefevre, MD  atorvastatin (LIPITOR) 10 MG tablet Take 10 mg by mouth daily.  07/05/19  [provider]    Allergies    Penicillins, Bee venom, Clindamycin/lincomycin, and Cranberry  Review of Systems   Review of Systems  Cardiovascular: Positive for palpitations.  Gastrointestinal: Positive for nausea and vomiting.  All other systems reviewed and are negative.   Physical Exam Updated Vital Signs BP 140/89 (BP Location: Right Arm)   Pulse 82   Temp 98 F (36.7 C) (Oral)   Resp 18   Wt 88 kg   SpO2 98%   BMI 30.38 kg/m   Physical Exam Vitals and nursing note reviewed.  Constitutional:      Appearance: Normal appearance.  HENT:     Head: Normocephalic and atraumatic.     Right  Ear: External ear normal.     Left Ear: External ear normal.     Nose: Nose normal.     Mouth/Throat:     Mouth: Mucous membranes are dry.  Eyes:     Extraocular Movements: Extraocular movements intact.     Conjunctiva/sclera: Conjunctivae normal.     Pupils: Pupils are equal, round, and reactive to light.  Cardiovascular:     Rate and Rhythm: Normal rate and regular rhythm.     Pulses: Normal pulses.     Heart sounds: Normal heart sounds.  Pulmonary:     Effort: Pulmonary effort is normal.     Breath sounds: Normal breath sounds.  Abdominal:     General: Abdomen is flat. Bowel sounds are normal.     Palpations: Abdomen is soft.  Musculoskeletal:        General: Normal range of motion.     Cervical back: Normal range of motion and neck supple.  Skin:    General: Skin is warm.     Capillary Refill: Capillary refill takes less than 2 seconds.  Neurological:     General:  No focal deficit present.     Mental Status: He is alert and oriented to person, place, and time.  Psychiatric:        Mood and Affect: Mood normal.        Behavior: Behavior normal.        Thought Content: Thought content normal.        Judgment: Judgment normal.     ED Results / Procedures / Treatments   Labs (all labs ordered are listed, but only abnormal results are displayed) Labs Reviewed  COMPREHENSIVE METABOLIC PANEL - Abnormal; Notable for the following components:      Result Value   Glucose, Bld 102 (*)    Total Protein 8.5 (*)    All other components within normal limits  CBC WITH DIFFERENTIAL/PLATELET  LIPASE, BLOOD  ETHANOL  URINALYSIS, ROUTINE W REFLEX MICROSCOPIC  RAPID URINE DRUG SCREEN, HOSP PERFORMED    EKG EKG Interpretation  Date/Time:  Sunday November 26 2019 19:55:09 EDT Ventricular Rate:  68 PR Interval:  164 QRS Duration: 88 QT Interval:  388 QTC Calculation: 412 R Axis:   76 Text Interpretation: Normal sinus rhythm Nonspecific T wave abnormality Abnormal ECG No significant change since last tracing Confirmed by Jacalyn Lefevre [phone]) on 11/26/2019 9:14:16 PM   Radiology No results found.  Procedures Procedures (including critical care time)  Medications Ordered in ED Medications  sodium chloride 0.9 % bolus 1,000 mL (1,000 mLs Intravenous New Bag/Given (Non-Interop) 11/26/19 2129)  ondansetron (ZOFRAN) injection 4 mg (4 mg Intravenous Given 11/26/19 2129)    ED Course  I have reviewed the triage vital signs and the nursing notes.  Pertinent labs & imaging results that were available during my care of the patient were reviewed by me and considered in my medical decision making (see chart for details).    MDM Rules/Calculators/A&P                          Pt is feeling much better after IVFs and zofran.  He is able to tolerate po fluids.  He knows to return if worse.  F/u with pcp.  Try to avoid etoh.  Final Clinical Impression(s)  / ED Diagnoses Final diagnoses:  Dehydration  Non-intractable vomiting with nausea, unspecified vomiting type  Acute alcoholic gastritis without hemorrhage    Rx /  DC Orders ED Discharge Orders         Ordered    ondansetron (ZOFRAN ODT) 4 MG disintegrating tablet  Every 8 hours PRN        11/26/19 2337           Jacalyn Lefevre, MD 11/26/19 2341

## 2019-11-26 NOTE — ED Triage Notes (Signed)
Pt c/o vomiting after drinking alcohol and taking an ecstasy pill last night.

## 2019-12-07 ENCOUNTER — Other Ambulatory Visit: Payer: Self-pay

## 2019-12-07 DIAGNOSIS — Z20822 Contact with and (suspected) exposure to covid-19: Secondary | ICD-10-CM

## 2019-12-08 LAB — NOVEL CORONAVIRUS, NAA: SARS-CoV-2, NAA: NOT DETECTED

## 2019-12-08 LAB — SARS-COV-2, NAA 2 DAY TAT

## 2020-02-04 ENCOUNTER — Other Ambulatory Visit: Payer: Self-pay

## 2020-02-04 ENCOUNTER — Emergency Department (HOSPITAL_COMMUNITY)
Admission: EM | Admit: 2020-02-04 | Discharge: 2020-02-04 | Disposition: A | Discharge status: Home / Self Care | Payer: Self-pay | Attending: Emergency Medicine | Admitting: Emergency Medicine

## 2020-02-04 ENCOUNTER — Encounter (HOSPITAL_COMMUNITY): Payer: Self-pay | Admitting: Emergency Medicine

## 2020-02-04 DIAGNOSIS — R369 Urethral discharge, unspecified: Secondary | ICD-10-CM

## 2020-02-04 DIAGNOSIS — Z202 Contact with and (suspected) exposure to infections with a predominantly sexual mode of transmission: Secondary | ICD-10-CM | POA: Insufficient documentation

## 2020-02-04 DIAGNOSIS — Z87891 Personal history of nicotine dependence: Secondary | ICD-10-CM | POA: Insufficient documentation

## 2020-02-04 DIAGNOSIS — N10 Acute tubulo-interstitial nephritis: Secondary | ICD-10-CM | POA: Insufficient documentation

## 2020-02-04 DIAGNOSIS — N12 Tubulo-interstitial nephritis, not specified as acute or chronic: Secondary | ICD-10-CM

## 2020-02-04 LAB — URINALYSIS, ROUTINE W REFLEX MICROSCOPIC
Bilirubin Urine: NEGATIVE
Glucose, UA: NEGATIVE mg/dL
Ketones, ur: NEGATIVE mg/dL
Nitrite: NEGATIVE
Protein, ur: >=300 mg/dL — AB
Specific Gravity, Urine: 1.023 (ref 1.005–1.030)
WBC, UA: >50 WBC/hpf — ABNORMAL HIGH (ref 0–5)
pH: 5 (ref 5.0–8.0)

## 2020-02-04 MED ORDER — LEVOFLOXACIN 750 MG PO TABS
750.0000 mg | ORAL_TABLET | Freq: Two times a day (BID) | ORAL | 0 refills | Status: AC
Start: 2020-02-04 — End: 2020-02-11

## 2020-02-04 MED ORDER — LEVOFLOXACIN 500 MG PO TABS
500.0000 mg | ORAL_TABLET | Freq: Two times a day (BID) | ORAL | 0 refills | Status: DC
Start: 2020-02-04 — End: 2020-02-04

## 2020-02-04 MED ORDER — KETOROLAC TROMETHAMINE 60 MG/2ML IM SOLN
60.0000 mg | Freq: Once | INTRAMUSCULAR | Status: AC
  Administered 2020-02-04: 60 mg via INTRAMUSCULAR
  Filled 2020-02-04: qty 2

## 2020-02-04 MED ORDER — METRONIDAZOLE 500 MG PO TABS
2000.0000 mg | ORAL_TABLET | Freq: Once | ORAL | Status: AC
  Administered 2020-02-04: 2000 mg via ORAL
  Filled 2020-02-04: qty 4

## 2020-02-04 NOTE — ED Provider Notes (Signed)
[facility] EMERGENCY DEPARTMENT Provider Note   CSN: [id number] Arrival date & time: 02/04/20  9983     History Chief Complaint  Patient presents with  . Exposure to STD    Miguel Robbins is a 40 y.o. male.  HPI 40 year old male with history of hyperlipidemia, migraines, trichomonas presents to the ER with complaints of penile discharge, mild burning with urination and bilateral back pain over the last couple days.  Patient states that "me and my old lady are not sleeping around with other people", but he states that they have a new roommates move in and they have been using her towels, thinks he may have gotten an STD from the towel.  He denies any fevers or chills.  No noticeable blood in his urine.  Has had brown spots on his underwear from the discharge.  Denies any scrotal or testicular pain.  No nausea or vomiting.  He has continued to be able to produce urine with no issues.  Past Medical History:  Diagnosis Date  . Arm fracture, left   . Hernia   . Hyperlipidemia   . Migraine   . Trichimoniasis     Patient Active Problem List   Diagnosis Date Noted  . Calculus of gallbladder with acute cholecystitis 12/24/2015  . Acute calculous cholecystitis 12/24/2015    Past Surgical History:  Procedure Laterality Date  . CHOLECYSTECTOMY N/A 12/25/2015   Procedure: LAPAROSCOPIC CHOLECYSTECTOMY;  Surgeon: Ancil Linsey, MD;  Location: [facility];  Service: General;  Laterality: N/A;  . HERNIA REPAIR    . HERNIA REPAIR    . left arm fracture         Family History  Problem Relation Age of Onset  . Hypertension Father   . Diabetes Father   . Hypertension Sister   . Diabetes Sister   . Hypertension Mother   . Hypertension Brother     Social History   Tobacco Use  . Smoking status: Former Smoker    Packs/day: 0.00    Years: 15.00    Pack years: 0.00    Types: Cigarettes    Quit date: 02/03/2019    Years since quitting: 1.0  . Smokeless tobacco: Never Used  Vaping  Use  . Vaping Use: Never used  Substance Use Topics  . Alcohol use: Yes    Comment: once a week   . Drug use: Not Currently    Comment: ecstacy    Home Medications Prior to Admission medications   Medication Sig Start Date End Date Taking? Authorizing Provider  ibuprofen (ADVIL) 600 MG tablet Take 1 tablet (600 mg total) by mouth every 6 (six) hours as needed for moderate pain. 10/27/19  Yes Sponseller, Eugene Gavia, PA-C  levofloxacin (LEVAQUIN) 500 MG tablet Take 1 tablet (500 mg total) by mouth in the morning and at bedtime for 7 days. 02/04/20 02/11/20 Yes Mare Ferrari, PA-C  metroNIDAZOLE (FLAGYL) 500 MG tablet Take 500 mg by mouth in the morning and at bedtime. 7 day course starting on 06/27/2019 Patient not taking: Reported on 02/04/2020    [provider]  ondansetron (ZOFRAN ODT) 4 MG disintegrating tablet Take 1 tablet (4 mg total) by mouth every 8 (eight) hours as needed for nausea or vomiting. Patient not taking: Reported on 02/04/2020 11/26/19   Jacalyn Lefevre, MD  atorvastatin (LIPITOR) 10 MG tablet Take 10 mg by mouth daily.  07/05/19  [provider]    Allergies    Penicillins, Bee venom, and  Cranberry  Review of Systems   Review of Systems  Genitourinary: Positive for dysuria, flank pain and penile discharge. Negative for decreased urine volume, frequency, genital sores, hematuria, penile pain, penile swelling, scrotal swelling, testicular pain and urgency.  Musculoskeletal: Positive for back pain.    Physical Exam Updated Vital Signs BP (!) 143/96 (BP Location: Left Arm)   Pulse 67   Temp 97.8 F (36.6 C) (Oral)   Resp 18   Ht 5\' 7"  (1.702 m)   Wt 86.2 kg   SpO2 97%   BMI 29.76 kg/m   Physical Exam Vitals and nursing note reviewed.  Constitutional:      General: He is not in acute distress.    Appearance: He is well-developed and well-nourished. He is not ill-appearing, toxic-appearing or diaphoretic.  HENT:     Head: Normocephalic and  atraumatic.  Eyes:     Conjunctiva/sclera: Conjunctivae normal.  Cardiovascular:     Rate and Rhythm: Normal rate and regular rhythm.     Heart sounds: No murmur heard.   Pulmonary:     Effort: Pulmonary effort is normal. No respiratory distress.     Breath sounds: Normal breath sounds.  Abdominal:     Palpations: Abdomen is soft.     Tenderness: There is no abdominal tenderness. There is no right CVA tenderness or left CVA tenderness.     Comments: No CVA tenderness on exam bilaterally  Genitourinary:    Comments: Chaperone present.  Urethral orifice with milky appearing discharge.  No visible lesions.  No scrotal tenderness, no epididymal/testicular tenderness.  No erythema, discoloration to the penis or scrotum. Musculoskeletal:        General: No edema. Normal range of motion.     Cervical back: Neck supple.  Skin:    General: Skin is warm and dry.     Capillary Refill: Capillary refill takes less than 2 seconds.  Neurological:     General: No focal deficit present.     Mental Status: He is alert and oriented to person, place, and time.  Psychiatric:        Mood and Affect: Mood and affect and mood normal.     ED Results / Procedures / Treatments   Labs (all labs ordered are listed, but only abnormal results are displayed) Labs Reviewed  URINALYSIS, ROUTINE W REFLEX MICROSCOPIC - Abnormal; Notable for the following components:      Result Value   APPearance HAZY (*)    Hgb urine dipstick SMALL (*)    Protein, ur >=300 (*)    Leukocytes,Ua MODERATE (*)    WBC, UA >50 (*)    Bacteria, UA FEW (*)    All other components within normal limits  URINE CULTURE  GC/CHLAMYDIA PROBE AMP ([place]) NOT AT [facility]    EKG None  Radiology No results found.  Procedures Procedures (including critical care time)  Medications Ordered in ED Medications  metroNIDAZOLE (FLAGYL) tablet 2,000 mg (has no administration in time range)  ketorolac (TORADOL) injection 60 mg (60 mg  Intramuscular Given 02/04/20 1017)    ED Course  I have reviewed the triage vital signs and the nursing notes.  Pertinent labs & imaging results that were available during my care of the patient were reviewed by me and considered in my medical decision making (see chart for details).    MDM Rules/Calculators/A&P  40 year old male who presents with dysuria and penile discharge as well as bilateral back pain.  Overall well-appearing, vitals reassuring.  Patient is afebrile here in the ER.  Physical exam with milky appearing discharge at the urethral meatus, no significant flank tenderness on exam noted.  UA with small white hemoglobin, moderate leukocytes, more than 50 WBCs and few bacteria.  Will send for culture.  Per chart review, patient has an anaphylactic reaction to penicillins.  Discussed these findings with the patient, patient still would like to be treated for STDs here.  Spoke with pharmacy who recommends levofloxacin 500 mg twice daily x7 days to treat the UTI and possible chlamydia.  Patient was treated with Flagyl here.  Will hold treatment for gonorrhea pending culture results.  Return precautions discussed. Final Clinical Impression(s) / ED Diagnoses Final diagnoses:  Penile discharge  Pyelonephritis    Rx / DC Orders ED Discharge Orders         Ordered    levofloxacin (LEVAQUIN) 500 MG tablet  2 times daily        02/04/20 1044           Lyndel Safe 02/04/20 1051    Milton Ferguson, MD 02/08/20 1029

## 2020-02-04 NOTE — ED Triage Notes (Signed)
Patient c/o mid to lower back pain with dysuria that started yesterday. Denies any fevers. Per patient yellow/brownish discharge with some odor. Patient questioning UTI or STD.

## 2020-02-04 NOTE — Discharge Instructions (Addendum)
Please take the antibiotic as directed until finished.  He will also treated for chlamydia and trichomoniasis.  Given your severe reaction to penicillins, we will hold treatment for gonorrhea to see if this comes back positive.   Please follow up with the urologist as UTI's to make sure the antibiotic is working properly and UTI's in men are rare.  Please return to the ER for any new or worsening symptoms.

## 2020-02-05 ENCOUNTER — Telehealth (HOSPITAL_COMMUNITY): Payer: Self-pay | Admitting: Physician Assistant

## 2020-02-05 LAB — GC/CHLAMYDIA PROBE AMP (~~LOC~~) NOT AT ARMC
Chlamydia: NEGATIVE
Comment: NEGATIVE
Comment: NORMAL
Neisseria Gonorrhea: POSITIVE — AB

## 2020-02-05 LAB — URINE CULTURE: Culture: <10000 — AB

## 2020-02-05 MED ORDER — DOXYCYCLINE HYCLATE 100 MG PO CAPS: 100.0000 mg | ORAL_CAPSULE | Freq: Two times a day (BID) | ORAL | 0 refills | Status: AC

## 2020-02-05 NOTE — Telephone Encounter (Signed)
Reviewed patient's gonorrhea/chlamydia culture report.  Positive for gonorrhea.  Patient with anaphylactic reaction to penicillins.  Discussed with [facility] pharmacy, will try doxycycline 100 mg twice daily x7 days.  I informed the patient of his results and the fact that I did call in this medication for him.  He was informed to have all partners tested and to abstain from sex for 2 weeks.  He was given strict return precautions of his symptoms continue to get worse as he may require IV antibiotics, gentamicin as per discussion with pharmacy.  He voiced understanding and is agreeable.

## 2021-07-30 ENCOUNTER — Emergency Department (HOSPITAL_COMMUNITY)
Admission: EM | Admit: 2021-07-30 | Discharge: 2021-07-30 | Disposition: A | Discharge status: Home / Self Care | Payer: Self-pay | Attending: Emergency Medicine | Admitting: Emergency Medicine

## 2021-07-30 ENCOUNTER — Other Ambulatory Visit: Payer: Self-pay

## 2021-07-30 ENCOUNTER — Encounter (HOSPITAL_COMMUNITY): Payer: Self-pay | Admitting: Emergency Medicine

## 2021-07-30 ENCOUNTER — Emergency Department (HOSPITAL_COMMUNITY): Payer: Self-pay

## 2021-07-30 DIAGNOSIS — Y990 Civilian activity done for income or pay: Secondary | ICD-10-CM | POA: Insufficient documentation

## 2021-07-30 DIAGNOSIS — M940 Chondrocostal junction syndrome [Tietze]: Secondary | ICD-10-CM | POA: Insufficient documentation

## 2021-07-30 DIAGNOSIS — Y9389 Activity, other specified: Secondary | ICD-10-CM | POA: Insufficient documentation

## 2021-07-30 DIAGNOSIS — X503XXA Overexertion from repetitive movements, initial encounter: Secondary | ICD-10-CM | POA: Insufficient documentation

## 2021-07-30 MED ORDER — IBUPROFEN 600 MG PO TABS: 600.0000 mg | ORAL_TABLET | Freq: Three times a day (TID) | ORAL | 0 refills | Status: DC

## 2021-07-30 MED ORDER — IBUPROFEN 800 MG PO TABS
800.0000 mg | ORAL_TABLET | Freq: Once | ORAL | Status: AC
  Administered 2021-07-30: 800 mg via ORAL
  Filled 2021-07-30: qty 1

## 2021-07-30 NOTE — ED Triage Notes (Signed)
Pt to the Ed after picking a bucket up at work and hearing a pop to the left side of his rib cage. Pt first injured the area on Monday night and noticed severe pain when he made the same motion at work last night.

## 2021-07-30 NOTE — Discharge Instructions (Addendum)
Your xray is negative for lung pathology (no pneumonia or other infection, no mass) and your ribs are healthy with no sign of fracture.  Your exam suggests you have inflammation of the cartilage and connective tissues of your chest wall (which cannot be seen on an xray) but should go away with ibuprofen and heat therapy (heating pad applied to your chest wall for 20 minutes several times daily). Try to avoid heavy lifting for the next week as this continues to heal.

## 2021-07-30 NOTE — ED Provider Notes (Signed)
[facility] EMERGENCY DEPARTMENT Provider Note   CSN: [id number] Arrival date & time: 07/30/21  6381     History  Chief Complaint  Patient presents with   Rib Injury    Miguel Robbins is a 41 y.o. male presenting for evaluation of left-sided chest pain which has been present for the past 3 days after lifting a heavy bucket at work.  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  2 nights ago while attempting this task he had sudden onset of pain in his left rib cage and heard a popping sensation.  He has had persistent pain at the site since this event, it is worsened with deep inspiration and with palpation and certain movements.  He denies shortness of breath.  He states he has had this symptom before, was never evaluated it simply resolved gradually over time.  Denies cough, fevers, abdominal pain, nausea or vomiting.  He has had no treatment for symptoms prior to arrival.  The history is provided by the patient.       Home Medications Prior to Admission medications   Medication Sig Start Date End Date Taking? Authorizing Provider  ibuprofen (ADVIL) 600 MG tablet Take 1 tablet (600 mg total) by mouth 3 (three) times daily. 07/30/21  Yes Jadwiga Faidley, Raynelle Fanning, PA-C  atorvastatin (LIPITOR) 10 MG tablet Take 10 mg by mouth daily.  07/05/19  [provider]      Allergies    Penicillins, Bee venom, and Cranberry    Review of Systems   Review of Systems  Constitutional:  Negative for chills and fever.  HENT:  Negative for congestion.   Eyes: Negative.   Respiratory:  Negative for chest tightness and shortness of breath.   Cardiovascular:  Positive for chest pain.  Gastrointestinal:  Negative for abdominal pain, nausea and vomiting.  Genitourinary: Negative.   Musculoskeletal:  Negative for arthralgias, joint swelling and neck pain.  Skin: Negative.  Negative  for rash and wound.  Neurological:  Negative for dizziness, weakness, light-headedness, numbness and headaches.  Psychiatric/Behavioral: Negative.    All other systems reviewed and are negative.   Physical Exam Updated Vital Signs BP (!) 157/66 (BP Location: Right Arm)   Pulse 60   Temp 98 F (36.7 C) (Oral)   Resp 17   Ht 5\' 7"  (1.702 m)   Wt 107 kg   SpO2 99%   BMI 36.96 kg/m  Physical Exam Vitals and nursing note reviewed.  Constitutional:      Appearance: He is well-developed.  HENT:     Head: Normocephalic and atraumatic.  Eyes:     Conjunctiva/sclera: Conjunctivae normal.  Cardiovascular:     Rate and Rhythm: Normal rate and regular rhythm.     Heart sounds: Normal heart sounds.  Pulmonary:     Effort: Pulmonary effort is normal.     Breath sounds: Normal breath sounds. No wheezing.  Chest:     Chest wall: Tenderness present. No mass, deformity, swelling, crepitus or edema.       Comments: Localized pain to the left mid to lower rib cage laterally to the mid axillary line.  There is no palpable deformity. Abdominal:     General: Bowel sounds are normal.     Palpations: Abdomen is soft.     Tenderness: There is no abdominal tenderness. There is no guarding.  Musculoskeletal:  General: Normal range of motion.     Cervical back: Normal range of motion.  Skin:    General: Skin is warm and dry.  Neurological:     Mental Status: He is alert.     ED Results / Procedures / Treatments   Labs (all labs ordered are listed, but only abnormal results are displayed) Labs Reviewed - No data to display  EKG None  Radiology DG Ribs Unilateral W/Chest Left  Result Date: 07/30/2021 CLINICAL DATA:  41 year old male with left rib pain after heavy lifting injury. EXAM: LEFT RIBS AND CHEST - 3+ VIEW COMPARISON:  Chest radiographs 12/15/2015. FINDINGS: PA view at 0940 hours. Lung volumes and mediastinal contours remain normal. Visualized tracheal air column is within  normal limits. Both lungs appear clear. No pneumothorax or pleural effusion. Cholecystectomy clips in the right upper quadrant. Negative visible bowel gas. Four oblique views of the left lower ribs. Rib markers in place at the anterior left 9th rib level. No left rib fracture or lesion identified. Normal background bone mineralization. Other visible osseous structures appear intact. IMPRESSION: 1. No left rib fracture or lesion identified radiographically. 2. No acute cardiopulmonary abnormality. Electronically Signed   By: Odessa Fleming M.D.   On: 07/30/2021 10:23    Procedures Procedures    Medications Ordered in ED Medications  ibuprofen (ADVIL) tablet 800 mg (800 mg Oral Given 07/30/21 1145)    ED Course/ Medical Decision Making/ A&P                           Medical Decision Making Patient with sudden onset reproducible left-sided chest wall pain after lifting heavy object.  No palpitations, no shortness of breath, vital signs are stable here.  Specifically he is afebrile with a normal pulse ox and heart rate.  Normal respiratory rate and pattern.  PE, cardiac source of symptoms unlikely given the reproducible nature.  He has no pneumonia based on today's chest x-ray, also no rib fractures.  I suspect this is a chest wall strain/costochondritis without rib fracture.  Discussed home treatment including ibuprofen, heat, restricted lifting until symptoms are resolved.  Return precautions were outlined.  Amount and/or Complexity of Data Reviewed Radiology: ordered.    Details: Negative for pneumonia or rib fracture.  Risk Prescription drug management.           Final Clinical Impression(s) / ED Diagnoses Final diagnoses:  Costochondritis, acute    Rx / DC Orders ED Discharge Orders          Ordered    ibuprofen (ADVIL) 600 MG tablet  3 times daily        07/30/21 1128              Burgess Amor, Cordelia Poche 07/30/21 1229    Bethann Berkshire, MD 07/30/21 1644

## 2022-01-06 ENCOUNTER — Emergency Department (HOSPITAL_COMMUNITY): Admission: EM | Admit: 2022-01-06 | Discharge: 2022-01-06 | Discharge status: Against Medical Advice | Payer: Self-pay

## 2022-06-16 ENCOUNTER — Other Ambulatory Visit: Payer: Self-pay

## 2022-06-16 ENCOUNTER — Encounter (HOSPITAL_COMMUNITY): Payer: Self-pay

## 2022-06-16 ENCOUNTER — Emergency Department (HOSPITAL_COMMUNITY)
Admission: EM | Admit: 2022-06-16 | Discharge: 2022-06-16 | Disposition: A | Discharge status: Home / Self Care | Payer: 59 | Attending: Emergency Medicine | Admitting: Emergency Medicine

## 2022-06-16 DIAGNOSIS — L723 Sebaceous cyst: Secondary | ICD-10-CM

## 2022-06-16 MED ORDER — LIDOCAINE HCL (PF) 2 % IJ SOLN
INTRAMUSCULAR | Status: AC
  Filled 2022-06-16: qty 5

## 2022-06-16 MED ORDER — DOXYCYCLINE HYCLATE 100 MG PO CAPS: 100.0000 mg | ORAL_CAPSULE | Freq: Two times a day (BID) | ORAL | 0 refills | Status: DC

## 2022-06-16 MED ORDER — POVIDONE-IODINE 10 % EX SOLN
CUTANEOUS | Status: DC | PRN
  Filled 2022-06-16: qty 14.8

## 2022-06-16 MED ORDER — LIDOCAINE HCL (PF) 2 % IJ SOLN: 5.0000 mL | Freq: Once | INTRAMUSCULAR | Status: DC

## 2022-06-16 NOTE — ED Triage Notes (Signed)
Pt c/o nodule to back of the head that has been there for 2 years. Pt wants it drained or removed. No redness or drainage to area. Pt said it does hurt.

## 2022-06-16 NOTE — Discharge Instructions (Signed)
Keep the area clean and dry.  Frequent warm water soaks or compresses to the area.  Take the antibiotic as directed.  Contact the dermatologist listed to arrange follow-up appointment.

## 2022-06-16 NOTE — ED Provider Notes (Signed)
[place] EMERGENCY DEPARTMENT AT [facility] Provider Note   CSN: [id number] Arrival date & time: 06/16/22  0830     History  Chief Complaint  Patient presents with   Cyst    Miguel Robbins is a 42 y.o. male.  HPI     Miguel Robbins is a 42 y.o. male who presents to the Emergency Department complaining of localized area of swelling and discomfort to the occipital scalp.  He states that he has had a nodule to this area for 2 years has recently increased in size.  States he is here to have the area excised or drained.  He denies fever or chills or neck pain.    Home Medications Prior to Admission medications   Medication Sig Start Date End Date Taking? Authorizing Provider  ibuprofen (ADVIL) 600 MG tablet Take 1 tablet (600 mg total) by mouth 3 (three) times daily. 07/30/21   Burgess Amor, PA-C  atorvastatin (LIPITOR) 10 MG tablet Take 10 mg by mouth daily.  07/05/19  [provider]      Allergies    Penicillins, Bee venom, and Cranberry    Review of Systems   Review of Systems  Constitutional:  Negative for chills and fever.  Gastrointestinal:  Negative for nausea and vomiting.  Musculoskeletal:  Negative for neck pain and neck stiffness.  Skin:  Negative for color change.       Nodule of the scalp  Neurological:  Negative for dizziness, weakness, numbness and headaches.    Physical Exam Updated Vital Signs BP (!) 141/92 (BP Location: Left Arm)   Pulse 68   Temp 97.8 F (36.6 C) (Oral)   Resp 18   Ht 5\' 7"  (1.702 m)   Wt 79.4 kg   SpO2 100%   BMI 27.41 kg/m  Physical Exam Vitals and nursing note reviewed.  Constitutional:      General: He is not in acute distress.    Appearance: Normal appearance. He is not ill-appearing or toxic-appearing.  HENT:     Head:     Comments: Dime size soft nodule mid occipital scalp, no drainage or surrounding erythema. Cardiovascular:     Rate and Rhythm: Normal rate and regular rhythm.     Pulses:  Normal pulses.  Pulmonary:     Effort: Pulmonary effort is normal.  Musculoskeletal:        General: Normal range of motion.     Cervical back: Normal range of motion.  Lymphadenopathy:     Cervical: No cervical adenopathy.  Skin:    General: Skin is warm.     Capillary Refill: Capillary refill takes less than 2 seconds.     Findings: No erythema.  Neurological:     General: No focal deficit present.     Mental Status: He is alert.     Sensory: No sensory deficit.     Motor: No weakness.     ED Results / Procedures / Treatments   Labs (all labs ordered are listed, but only abnormal results are displayed) Labs Reviewed - No data to display  EKG None  Radiology No results found.  Procedures Procedures     INCISION AND DRAINAGE Performed by: Chariti Havel Consent: Verbal consent obtained. Risks and benefits: risks, benefits and alternatives were discussed Type: abscess  Body area: occipital scalp  Anesthesia: local infiltration  Incision was made with a #11 scalpel.  Local anesthetic: lidocaine 2% w/o epinephrine  Anesthetic total: 3 ml  Complexity: complex  Blunt dissection to break up loculations  Drainage: thick, sebaceous material Drainage amount: small  Packing material: 1/4 in iodoform gauze  Patient tolerance: Patient tolerated the procedure well with no immediate complications.   Medications Ordered in ED Medications  lidocaine HCl (PF) (XYLOCAINE) 2 % injection 5 mL (has no administration in time range)  povidone-iodine (BETADINE) 10 % external solution (has no administration in time range)  lidocaine HCl (PF) (XYLOCAINE) 2 % injection (has no administration in time range)    ED Course/ Medical Decision Making/ A&P                             Medical Decision Making Patient here for persistent nodule of the scalp.  Present for 2 years as reported.  Here requesting area be excised or removed.  Denies associated symptoms.  On exam,  appears to be epidermal cyst, no surrounding erythema, no lymphadenopathy of the neck or scalp.  Patient well-appearing.  Discussed in detail risks of recurrence and incomplete drainage.  Patient verbalized understanding and request to proceed with procedure.  Amount and/or Complexity of Data Reviewed Discussion of management or test interpretation with external provider(s): Likely epidermal cyst with incomplete drainage from I&D.  Doubt emergent process.  Doubt lymphadenopathy.  Iodoform gauze placed as some purulent material was expressed.  Patient will be started on oral antibiotics and is agreeable to outpatient follow-up with dermatology  Risk OTC drugs. Prescription drug management.           Final Clinical Impression(s) / ED Diagnoses Final diagnoses:  Sebaceous cyst    Rx / DC Orders ED Discharge Orders     None         Pauline Aus, PA-C 06/18/22 1426    Loetta Rough, MD 06/25/22 2246

## 2022-07-24 ENCOUNTER — Other Ambulatory Visit: Payer: Self-pay

## 2022-07-24 ENCOUNTER — Emergency Department (HOSPITAL_COMMUNITY)
Admission: EM | Admit: 2022-07-24 | Discharge: 2022-07-24 | Disposition: A | Discharge status: Home / Self Care | Payer: 59 | Attending: Emergency Medicine | Admitting: Emergency Medicine

## 2022-07-24 ENCOUNTER — Encounter (HOSPITAL_COMMUNITY): Payer: Self-pay | Admitting: Emergency Medicine

## 2022-07-24 DIAGNOSIS — R220 Localized swelling, mass and lump, head: Secondary | ICD-10-CM | POA: Diagnosis not present

## 2022-07-24 DIAGNOSIS — L989 Disorder of the skin and subcutaneous tissue, unspecified: Secondary | ICD-10-CM | POA: Diagnosis not present

## 2022-07-24 DIAGNOSIS — M7989 Other specified soft tissue disorders: Secondary | ICD-10-CM

## 2022-07-24 MED ORDER — TETANUS-DIPHTH-ACELL PERTUSSIS 5-2.5-18.5 LF-MCG/0.5 IM SUSY
0.5000 mL | PREFILLED_SYRINGE | Freq: Once | INTRAMUSCULAR | Status: DC
  Filled 2022-07-24: qty 0.5

## 2022-07-24 NOTE — Discharge Instructions (Signed)
Is a pleasure taking care of you today.  The cyst on your scalp has opened back up.  Please take the antibiotics as prescribed.  Keep the area clean and dry, come back if you have drainage, fever, swelling or pain.  You will likely need to have either dermatology or general surgery as listed above excising this.

## 2022-07-24 NOTE — ED Provider Notes (Signed)
[place] EMERGENCY DEPARTMENT AT [facility] Provider Note   CSN: [id number] Arrival date & time: 07/24/22  1834     History  Chief Complaint  Patient presents with   Abscess    Miguel Robbins is a 42 y.o. male.  Evaluation of a bump on the back of his head.  It has  been going on for about a month.  He was seen in the emergency department and had incision and drainage of a likely sebaceous cyst.  He was given a prescription for doxycycline at that time.  He reports he had some medication left over at home, about 5 tablets.  He states he took those and only today did he pick up the prescription.  He states that the wound had started to heal, and then "busted back open".  He is here today asking if we can suture it closed.  He has contacted dermatology for follow-up appointment but has not received a call back regarding appointment yet.  Denies fever, purulent drainage from the wound, increased pain or swelling or any other new or worsening symptoms.  He was also asking for a tetanus shot today as he states it has been over 10 years since he had wound and with an open wound he was hoping to get a tetanus shot.   Abscess      Home Medications Prior to Admission medications   Medication Sig Start Date End Date Taking? Authorizing Provider  doxycycline (VIBRAMYCIN) 100 MG capsule Take 1 capsule (100 mg total) by mouth 2 (two) times daily. 06/16/22   Triplett, Tammy, PA-C  ibuprofen (ADVIL) 600 MG tablet Take 1 tablet (600 mg total) by mouth 3 (three) times daily. Patient not taking: Reported on 06/16/2022 07/30/21   Burgess Amor, PA-C  atorvastatin (LIPITOR) 10 MG tablet Take 10 mg by mouth daily.  07/05/19  [provider]      Allergies    Penicillins, Bee venom, and Cranberry    Review of Systems   Review of Systems  Physical Exam Updated Vital Signs BP (!) 129/95 (BP Location: Right Arm)   Pulse 73   Temp 98.2 F (36.8 C) (Oral)   Resp 16   Ht 5'  7" (1.702 m)   Wt 100.7 kg   SpO2 99%   BMI 34.77 kg/m  Physical Exam Vitals and nursing note reviewed.  Constitutional:      General: He is not in acute distress.    Appearance: He is well-developed.  HENT:     Head: Normocephalic and atraumatic.  Eyes:     Conjunctiva/sclera: Conjunctivae normal.  Cardiovascular:     Rate and Rhythm: Normal rate and regular rhythm.     Heart sounds: No murmur heard. Pulmonary:     Effort: Pulmonary effort is normal. No respiratory distress.     Breath sounds: Normal breath sounds.  Abdominal:     Palpations: Abdomen is soft.     Tenderness: There is no abdominal tenderness.  Musculoskeletal:        General: No swelling.     Cervical back: Neck supple.  Skin:    General: Skin is warm and dry.     Capillary Refill: Capillary refill takes less than 2 seconds.     Comments: Small firm swollen area under the scalp posteriorly.  No fluctuance or induration, no drainage.  Minimal erythema of the wound edges.  There is no crepitus.  Neurological:     Mental Status: He is alert.  Psychiatric:        Mood and Affect: Mood normal.     ED Results / Procedures / Treatments   Labs (all labs ordered are listed, but only abnormal results are displayed) Labs Reviewed - No data to display  EKG None  Radiology No results found.  Procedures Procedures    Medications Ordered in ED Medications  Tdap (BOOSTRIX) injection 0.5 mL (0.5 mLs Intramuscular Not Given 07/24/22 2032)    ED Course/ Medical Decision Making/ A&P                             Medical Decision Making DDx: Abscess, cellulitis, epidermal inclusion cyst, lymphadenitis, hematoma, lipoma, other  ED course: Patient incision and drainage of what was felt to be likely epidermal inclusion cyst last month, did not take his course of antibiotics but did pick them up today he is asking for this wound to be sutured closed today and asking for a tetanus shot.  This with patient that is  not routine to fully excise these in the emergency department nor is it standard of care to suture these, especially a longstanding wound.  We discussed you need to follow-up with dermatology or surgery to have this excised, though we cannot excise it in the ER. I did offer tetanus update and patient was requesting this.  Unfortunately patient after being told we were not going to suture his wound left before receiving a tetanus shot.  He was advised to take his antibiotics that he has in hand today due to some mild erythema, but left before getting his paperwork with referrals.  He does already have the contact information for dermatology follow-up, however.   Risk Prescription drug management.           Final Clinical Impression(s) / ED Diagnoses Final diagnoses:  Mass of soft tissue    Rx / DC Orders ED Discharge Orders     None         Josem Kaufmann 07/25/22 1424    Lonell Grandchild, MD 07/25/22 1505

## 2022-07-24 NOTE — ED Triage Notes (Addendum)
Pt via POV c/o continued pain r/t abscess on posterior scalp, recently treated here for same. Pt completed 1st round of antibiotic and picked up the 2nd rx today but has not yet started the new medication. Pt thinks his wound may have opened back up and does not feel like the infection is improving. Pain rated 2/10 with pressure. Denies n/v/d/fever/chills. Last tetanus shot was in 2010 and pt is requesting a booster.

## 2022-07-24 NOTE — ED Provider Notes (Incomplete)
  [place] EMERGENCY DEPARTMENT AT [facility] Provider Note   CSN: [id number] Arrival date & time: 07/24/22  1834     History {Add pertinent medical, surgical, social history, OB history to HPI:1} Chief Complaint  Patient presents with  . Abscess    Miguel Robbins is a 42 y.o. male.  Evaluation of a bump on the back of his head.  It has  been going on for about a month.    Abscess      Home Medications Prior to Admission medications   Medication Sig Start Date End Date Taking? Authorizing Provider  doxycycline (VIBRAMYCIN) 100 MG capsule Take 1 capsule (100 mg total) by mouth 2 (two) times daily. 06/16/22   Triplett, Tammy, PA-C  ibuprofen (ADVIL) 600 MG tablet Take 1 tablet (600 mg total) by mouth 3 (three) times daily. Patient not taking: Reported on 06/16/2022 07/30/21   Burgess Amor, PA-C  atorvastatin (LIPITOR) 10 MG tablet Take 10 mg by mouth daily.  07/05/19  [provider]      Allergies    Penicillins, Bee venom, and Cranberry    Review of Systems   Review of Systems  Physical Exam Updated Vital Signs BP (!) 129/95 (BP Location: Right Arm)   Pulse 73   Temp 98.2 F (36.8 C) (Oral)   Resp 16   Ht 5\' 7"  (1.702 m)   Wt 100.7 kg   SpO2 99%   BMI 34.77 kg/m  Physical Exam  ED Results / Procedures / Treatments   Labs (all labs ordered are listed, but only abnormal results are displayed) Labs Reviewed - No data to display  EKG None  Radiology No results found.  Procedures Procedures  {Document cardiac monitor, telemetry assessment procedure when appropriate:1}  Medications Ordered in ED Medications  Tdap (BOOSTRIX) injection 0.5 mL (0.5 mLs Intramuscular Not Given 07/24/22 2032)    ED Course/ Medical Decision Making/ A&P   {   Click here for ABCD2, HEART and other calculatorsREFRESH Note before signing :1}                          Medical Decision Making Risk Prescription drug management.   ***  {Document critical  care time when appropriate:1} {Document review of labs and clinical decision tools ie heart score, Chads2Vasc2 etc:1}  {Document your independent review of radiology images, and any outside records:1} {Document your discussion with family members, caretakers, and with consultants:1} {Document social determinants of health affecting pt's care:1} {Document your decision making why or why not admission, treatments were needed:1} Final Clinical Impression(s) / ED Diagnoses Final diagnoses:  Mass of soft tissue    Rx / DC Orders ED Discharge Orders     None

## 2023-01-01 DIAGNOSIS — Z880 Allergy status to penicillin: Secondary | ICD-10-CM | POA: Diagnosis not present

## 2023-01-01 DIAGNOSIS — K0889 Other specified disorders of teeth and supporting structures: Secondary | ICD-10-CM | POA: Diagnosis not present

## 2023-01-01 DIAGNOSIS — K047 Periapical abscess without sinus: Secondary | ICD-10-CM | POA: Diagnosis not present

## 2023-02-14 DIAGNOSIS — R1012 Left upper quadrant pain: Secondary | ICD-10-CM | POA: Diagnosis not present

## 2023-02-14 DIAGNOSIS — Z880 Allergy status to penicillin: Secondary | ICD-10-CM | POA: Diagnosis not present

## 2023-02-14 DIAGNOSIS — R109 Unspecified abdominal pain: Secondary | ICD-10-CM | POA: Diagnosis not present

## 2023-02-14 DIAGNOSIS — R10812 Left upper quadrant abdominal tenderness: Secondary | ICD-10-CM | POA: Diagnosis not present

## 2023-04-16 ENCOUNTER — Emergency Department (HOSPITAL_COMMUNITY): Payer: Self-pay

## 2023-04-16 ENCOUNTER — Other Ambulatory Visit: Payer: Self-pay

## 2023-04-16 ENCOUNTER — Emergency Department (HOSPITAL_COMMUNITY)
Admission: EM | Admit: 2023-04-16 | Discharge: 2023-04-16 | Disposition: A | Discharge status: Home / Self Care | Payer: Self-pay | Attending: Emergency Medicine | Admitting: Emergency Medicine

## 2023-04-16 DIAGNOSIS — D72819 Decreased white blood cell count, unspecified: Secondary | ICD-10-CM | POA: Insufficient documentation

## 2023-04-16 DIAGNOSIS — R1030 Lower abdominal pain, unspecified: Secondary | ICD-10-CM | POA: Insufficient documentation

## 2023-04-16 DIAGNOSIS — R748 Abnormal levels of other serum enzymes: Secondary | ICD-10-CM | POA: Insufficient documentation

## 2023-04-16 DIAGNOSIS — K859 Acute pancreatitis without necrosis or infection, unspecified: Secondary | ICD-10-CM

## 2023-04-16 DIAGNOSIS — R0789 Other chest pain: Secondary | ICD-10-CM | POA: Insufficient documentation

## 2023-04-16 DIAGNOSIS — L570 Actinic keratosis: Secondary | ICD-10-CM

## 2023-04-16 DIAGNOSIS — E876 Hypokalemia: Secondary | ICD-10-CM | POA: Insufficient documentation

## 2023-04-16 LAB — COMPREHENSIVE METABOLIC PANEL
ALT: 46 U/L — ABNORMAL HIGH (ref 0–44)
AST: 42 U/L — ABNORMAL HIGH (ref 15–41)
Albumin: 3.9 g/dL (ref 3.5–5.0)
Alkaline Phosphatase: 46 U/L (ref 38–126)
Anion gap: 14 (ref 5–15)
BUN: 10 mg/dL (ref 6–20)
CO2: 26 mmol/L (ref 22–32)
Calcium: 9.3 mg/dL (ref 8.9–10.3)
Chloride: 99 mmol/L (ref 98–111)
Creatinine, Ser: 0.82 mg/dL (ref 0.61–1.24)
GFR, Estimated: >60 mL/min (ref >60)
Glucose, Bld: 146 mg/dL — ABNORMAL HIGH (ref 70–99)
Potassium: 3.1 mmol/L — ABNORMAL LOW (ref 3.5–5.1)
Sodium: 139 mmol/L (ref 135–145)
Total Bilirubin: 0.8 mg/dL (ref 0.0–1.2)
Total Protein: 7.8 g/dL (ref 6.5–8.1)

## 2023-04-16 LAB — CBC WITH DIFFERENTIAL/PLATELET
Abs Immature Granulocytes: 0.01 10*3/uL (ref 0.00–0.07)
Basophils Absolute: 0 10*3/uL (ref 0.0–0.1)
Basophils Relative: 1 %
Eosinophils Absolute: 0 10*3/uL (ref 0.0–0.5)
Eosinophils Relative: 0 %
HCT: 40.5 % (ref 39.0–52.0)
Hemoglobin: 13.7 g/dL (ref 13.0–17.0)
Immature Granulocytes: 0 %
Lymphocytes Relative: 46 %
Lymphs Abs: 1.6 10*3/uL (ref 0.7–4.0)
MCH: 29.6 pg (ref 26.0–34.0)
MCHC: 33.8 g/dL (ref 30.0–36.0)
MCV: 87.5 fL (ref 80.0–100.0)
Monocytes Absolute: 0.3 10*3/uL (ref 0.1–1.0)
Monocytes Relative: 8 %
Neutro Abs: 1.6 10*3/uL — ABNORMAL LOW (ref 1.7–7.7)
Neutrophils Relative %: 45 %
Platelets: 284 10*3/uL (ref 150–400)
RBC: 4.63 MIL/uL (ref 4.22–5.81)
RDW: 13.1 % (ref 11.5–15.5)
WBC: 3.4 10*3/uL — ABNORMAL LOW (ref 4.0–10.5)
nRBC: 0 % (ref 0.0–0.2)

## 2023-04-16 LAB — LIPASE, BLOOD: Lipase: 87 U/L — ABNORMAL HIGH (ref 11–51)

## 2023-04-16 LAB — D-DIMER, QUANTITATIVE: D-Dimer, Quant: <0.27 ug{FEU}/mL (ref 0.00–0.50)

## 2023-04-16 LAB — TROPONIN I (HIGH SENSITIVITY): Troponin I (High Sensitivity): 4 ng/L (ref <18)

## 2023-04-16 MED ORDER — OMEPRAZOLE 20 MG PO CPDR: 20.0000 mg | DELAYED_RELEASE_CAPSULE | Freq: Every day | ORAL | 0 refills | Status: DC

## 2023-04-16 MED ORDER — IOHEXOL 300 MG/ML  SOLN
100.0000 mL | Freq: Once | INTRAMUSCULAR | Status: AC | PRN
  Administered 2023-04-16: 100 mL via INTRAVENOUS

## 2023-04-16 MED ORDER — KETOROLAC TROMETHAMINE 15 MG/ML IJ SOLN
15.0000 mg | Freq: Once | INTRAMUSCULAR | Status: DC
  Filled 2023-04-16: qty 1

## 2023-04-16 MED ORDER — DICYCLOMINE HCL 20 MG PO TABS: 20.0000 mg | ORAL_TABLET | Freq: Two times a day (BID) | ORAL | 0 refills | Status: DC

## 2023-04-16 MED ORDER — ONDANSETRON 4 MG PO TBDP: 4.0000 mg | ORAL_TABLET | Freq: Three times a day (TID) | ORAL | 0 refills | Status: DC | PRN

## 2023-04-16 MED ORDER — POTASSIUM CHLORIDE CRYS ER 20 MEQ PO TBCR
40.0000 meq | EXTENDED_RELEASE_TABLET | Freq: Once | ORAL | Status: AC
  Administered 2023-04-16: 40 meq via ORAL
  Filled 2023-04-16: qty 2

## 2023-04-16 NOTE — ED Triage Notes (Signed)
 Pt arrived POV with complaints of left flank pain made worse with exertion, primarily lifting. Pain has been persistent since November of 2024 when he lifted a heavy object at work. He also reports a cyst on his head that was I&D'd recently, but has not drained properly.

## 2023-04-16 NOTE — ED Provider Notes (Signed)
 [place] EMERGENCY DEPARTMENT AT [facility] Provider Note   CSN: [id number] Arrival date & time: 04/16/23  8841     History  Chief Complaint  Patient presents with   Flank Pain    Miguel Robbins is a 43 y.o. male.  Patient is a 43 year old male who presents emergency department the chief complaint of pain to the left lateral aspect of the chest and left upper flank.  Patient notes his symptoms have been ongoing for approximate the past week.  He notes that the pain is worse with deep inspiration and cough as well as worse with lifting.  Patient denies any recent falls or blunt trauma.  He notes that he did have influenza approximately 2 weeks ago.  He denies any associated fever or chills.  He denies any nausea, vomiting, diarrhea.  He notes that the pain is worse after eating.  He also notes that he has a cyst to the posterior aspect of his scalp which has been present for years.  He is asking to have this reevaluated today.   Flank Pain       Home Medications Prior to Admission medications   Medication Sig Start Date End Date Taking? Authorizing Provider  doxycycline (VIBRAMYCIN) 100 MG capsule Take 1 capsule (100 mg total) by mouth 2 (two) times daily. 06/16/22   Triplett, Tammy, PA-C  ibuprofen (ADVIL) 600 MG tablet Take 1 tablet (600 mg total) by mouth 3 (three) times daily. Patient not taking: Reported on 06/16/2022 07/30/21   Burgess Amor, PA-C  atorvastatin (LIPITOR) 10 MG tablet Take 10 mg by mouth daily.  07/05/19  [provider]      Allergies    Penicillins, Bee venom, and Cranberry    Review of Systems   Review of Systems  Genitourinary:  Positive for flank pain.  All other systems reviewed and are negative.   Physical Exam Updated Vital Signs BP 137/84 (BP Location: Right Arm)   Pulse 83   Temp 98.6 F (37 C) (Oral)   Resp 18   Ht 5\' 7"  (1.702 m)   Wt 100 kg   SpO2 99%   BMI 34.53 kg/m  Physical Exam Vitals and nursing note  reviewed.  Constitutional:      Appearance: Normal appearance.  HENT:     Head: Normocephalic and atraumatic.     Nose: Nose normal.     Mouth/Throat:     Mouth: Mucous membranes are moist.  Eyes:     Extraocular Movements: Extraocular movements intact.     Conjunctiva/sclera: Conjunctivae normal.     Pupils: Pupils are equal, round, and reactive to light.  Cardiovascular:     Rate and Rhythm: Normal rate and regular rhythm.     Pulses: Normal pulses.     Heart sounds: Normal heart sounds.  Pulmonary:     Effort: Pulmonary effort is normal. No respiratory distress.     Breath sounds: Normal breath sounds. No stridor. No wheezing, rhonchi or rales.     Comments: Tenderness palpation over left lateral chest Abdominal:     General: Abdomen is flat. Bowel sounds are normal. There is no distension.     Palpations: Abdomen is soft. There is no mass.     Tenderness: There is no abdominal tenderness. There is no guarding.  Musculoskeletal:        General: No swelling, tenderness or deformity. Normal range of motion.     Cervical back: Normal range of motion and neck supple.  No rigidity or tenderness.  Lymphadenopathy:     Cervical: No cervical adenopathy.  Skin:    General: Skin is warm and dry.     Comments: Keratotic lesion noted to posterior aspect of scalp, no areas of induration or fluctuance, no surrounding erythema, no purulent discharge  Neurological:     General: No focal deficit present.     Mental Status: He is alert and oriented to person, place, and time. Mental status is at baseline.     Cranial Nerves: No cranial nerve deficit.     Sensory: No sensory deficit.     Motor: No weakness.     Coordination: Coordination normal.     Gait: Gait normal.  Psychiatric:        Mood and Affect: Mood normal.        Behavior: Behavior normal.        Thought Content: Thought content normal.        Judgment: Judgment normal.     ED Results / Procedures / Treatments   Labs (all  labs ordered are listed, but only abnormal results are displayed) Labs Reviewed  COMPREHENSIVE METABOLIC PANEL  LIPASE, BLOOD  CBC WITH DIFFERENTIAL/PLATELET  D-DIMER, QUANTITATIVE  TROPONIN I (HIGH SENSITIVITY)    EKG None  Radiology No results found.  Procedures Procedures    Medications Ordered in ED Medications  ketorolac (TORADOL) 15 MG/ML injection 15 mg (15 mg Intravenous Incomplete 04/16/23 1008)    ED Course/ Medical Decision Making/ A&P                                 Medical Decision Making Amount and/or Complexity of Data Reviewed Labs: ordered. Radiology: ordered.  Risk Prescription drug management.   This patient presents to the ED for concern of chest pain, abdominal pain differential diagnosis includes acute appendicitis, cholecystitis, bowel torsion, diverticulitis, testicular torsion, pyelonephritis, kidney stone, pancreatitis, mesenteric ischemia, ACS, pulmonary embolus, pericarditis, myocarditis, endocarditis    Additional history obtained:  Additional history obtained from medical records External records from outside source obtained and reviewed including none   Lab Tests:  I Ordered, and personally interpreted labs.  The pertinent results include: Hypokalemia, normal kidney function, elevated liver enzymes, leukopenia, elevated lipase, normal troponin, normal D-dimer   Imaging Studies ordered:  I ordered imaging studies including x-ray, CT scan of abdomen and pelvis I independently visualized and interpreted imaging which showed no acute intrathoracic process, no acute intra-abdominal surgical process I agree with the radiologist interpretation   Medicines ordered and prescription drug management:  I ordered medication including Bentyl, Zofran, omeprazole for acute pancreatitis Reevaluation of the patient after these medicines showed that the patient improved I have reviewed the patients home medicines and have made adjustments as  needed   Problem List / ED Course:  Patient is doing well at this time and is stable for discharge home.  Discussed with patient that CT scan the abdomen pelvis demonstrated no indication for acute appendicitis, cholecystitis, bowel torsion, diverticulitis, ureteral stone, mesenteric ischemia.  Suspect mild pancreatitis at this time and will treat accordingly on an outpatient basis and recommend close follow-up with gastroenterology.  Patient has no indication for ACS at this time as EKG has no acute ischemic changes he has negative troponin.  He has no indication pulmonary embolus he is otherwise low risk and a negative D-dimer.  Do not suspect pericarditis or myocarditis as symptoms are not positional in  nature.  He is otherwise low risk for aortic aneurysm/dissection, endocarditis.  The lesion on the back of his head is consistent with a keratotic horn and he was directed to follow-up closely with dermatology on an outpatient basis.  Strict turn precautions were discussed for any new or worsening symptoms.  Patient voiced understanding and had no additional questions.   Social Determinants of Health:  None           Final Clinical Impression(s) / ED Diagnoses Final diagnoses:  None    Rx / DC Orders ED Discharge Orders     None         Kathlen Mody 04/16/23 1414    Bethann Berkshire, MD 04/17/23 [phone]

## 2023-04-16 NOTE — Discharge Instructions (Signed)
 Please follow-up closely with gastroenterology and dermatology on an outpatient basis.  Please adhere to a clear liquid diet as discussed at the bedside.  Return to the emergency department immediately for any new or worsening symptoms.

## 2023-05-19 ENCOUNTER — Ambulatory Visit (INDEPENDENT_AMBULATORY_CARE_PROVIDER_SITE_OTHER): Payer: Self-pay | Admitting: Gastroenterology

## 2023-05-19 ENCOUNTER — Encounter (INDEPENDENT_AMBULATORY_CARE_PROVIDER_SITE_OTHER): Payer: Self-pay | Admitting: Gastroenterology

## 2023-05-19 VITALS — BP 117/80 | HR 71 | Temp 97.7°F | Ht 67.0 in | Wt 233.1 lb

## 2023-05-19 DIAGNOSIS — R109 Unspecified abdominal pain: Secondary | ICD-10-CM

## 2023-05-19 DIAGNOSIS — Z6836 Body mass index (BMI) 36.0-36.9, adult: Secondary | ICD-10-CM | POA: Insufficient documentation

## 2023-05-19 DIAGNOSIS — K5904 Chronic idiopathic constipation: Secondary | ICD-10-CM | POA: Insufficient documentation

## 2023-05-19 DIAGNOSIS — R748 Abnormal levels of other serum enzymes: Secondary | ICD-10-CM | POA: Insufficient documentation

## 2023-05-19 DIAGNOSIS — R7401 Elevation of levels of liver transaminase levels: Secondary | ICD-10-CM

## 2023-05-19 NOTE — Patient Instructions (Addendum)
 It was very nice to meet you today, as dicussed with will plan for the following :  1) bloodwork   2) Ensure adequate fluid intake: Aim for 8 glasses of water daily. Follow a high fiber diet: Include foods such as dates, prunes, pears, and kiwi. Use Metamucil twice a day.

## 2023-05-19 NOTE — Progress Notes (Signed)
 Miguel Robbins Miguel Robbins , M.D. Gastroenterology & Hepatology [facility] [address] [place], [place] [address] Primary Care Physician: Pcp, No No address on file  Chief Complaint:  Elevated liver enzymes , constipation   History of Present Illness: Miguel Robbins is a 43 y.o. male with no significant medical issues who presents for evaluation of abdominal pain, elevated liver enzymes  Patient reports last Monday he was at work was lifting something and had a sudden onset left back pain. patient went to the ER and was told that he had pancreatitis.  Prior to that patient was drinking beer every day multiple packs a day.  Since then he has stopped drinking and adopt a healthy lifestyle with changing his diet The patient denies having any nausea, vomiting, fever, chills, hematochezia, melena, hematemesis, abdominal distention, diarrhea, jaundice, pruritus or weight loss.  Last UEA:VWUJ Last Colonoscopy:none  FHx: neg for any gastrointestinal/liver disease, no malignancies Social: Heavy alcohol use stopped drinking 1 month ago Surgical: Cholecystectomy  Labs with elevated liver enzymes AST 42 ALT 46 alk phos 46 T. bili point hemoglobin 13.7 platelet 284  Lipase : 87 Past Medical History: Past Medical History:  Diagnosis Date   Arm fracture, left    Hernia    Hyperlipidemia    Migraine    Trichimoniasis     Past Surgical History: Past Surgical History:  Procedure Laterality Date   CHOLECYSTECTOMY N/A 12/25/2015   Procedure: LAPAROSCOPIC CHOLECYSTECTOMY;  Surgeon: Franki Isles, MD;  Location: [facility];  Service: General;  Laterality: N/A;   HERNIA REPAIR     HERNIA REPAIR     left arm fracture      Family History: Family History  Problem Relation Age of Onset   Hypertension Father    Diabetes Father    Hypertension Sister    Diabetes Sister    Hypertension Mother    Hypertension Brother     Social History: Social  History   Tobacco Use  Smoking Status Former   Current packs/day: 0.00   Types: Cigarettes   Quit date: 02/02/2005   Years since quitting: 18.3   Passive exposure: Past  Smokeless Tobacco Never   Social History   Substance and Sexual Activity  Alcohol Use Not Currently   Social History   Substance and Sexual Activity  Drug Use Yes   Types: Marijuana   Comment: ecstacy    Allergies: Allergies  Allergen Reactions   Penicillins Anaphylaxis and Swelling   Bee Venom Swelling   Cranberry Itching    Medications: No current outpatient medications on file.   No current facility-administered medications for this visit.    Review of Systems: GENERAL: negative for malaise, night sweats HEENT: No changes in hearing or vision, no nose bleeds or other nasal problems. NECK: Negative for lumps, goiter, pain and significant neck swelling RESPIRATORY: Negative for cough, wheezing CARDIOVASCULAR: Negative for chest pain, leg swelling, palpitations, orthopnea GI: SEE HPI MUSCULOSKELETAL: Negative for joint pain or swelling, back pain, and muscle pain. SKIN: Negative for lesions, rash HEMATOLOGY Negative for prolonged bleeding, bruising easily, and swollen nodes. ENDOCRINE: Negative for cold or heat intolerance, polyuria, polydipsia and goiter. NEURO: negative for tremor, gait imbalance, syncope and seizures. The remainder of the review of systems is noncontributory.   Physical Exam: BP 117/80   Pulse 71   Temp 97.7 F (36.5 C)   Ht 5\' 7"  (1.702 m)   Wt 233 lb 1.6 oz (105.7 kg)   BMI  36.51 kg/m  GENERAL: The patient is AO x3, in no acute distress. HEENT: Head is normocephalic and atraumatic. EOMI are intact. Mouth is well hydrated and without lesions. NECK: Supple. No masses LUNGS: Clear to auscultation. No presence of rhonchi/wheezing/rales. Adequate chest expansion HEART: RRR, normal s1 and s2. ABDOMEN: Soft, nontender, no guarding, no peritoneal signs, and nondistended.  BS +. No masses.   Imaging/Labs: as above     Latest Ref Rng & Units 04/16/2023   10:20 AM 11/26/2019    9:30 PM 06/13/2018   11:26 AM  CBC  WBC 4.0 - 10.5 K/uL 3.4  7.6  4.2   Hemoglobin 13.0 - 17.0 g/dL 16.1  09.6  04.5   Hematocrit 39.0 - 52.0 % 40.5  46.0  43.2   Platelets 150 - 400 K/uL 284  329  296    No results found for: "IRON", "TIBC", "FERRITIN"  I personally reviewed and interpreted the available labs, imaging and endoscopic files.  CT Abdomen and Pelvis 04/2022   IMPRESSION: 1. No acute findings in the abdomen pelvis. 2. Postcholecystectomy.  Impression and Plan:  Miguel Robbins is a 43 y.o. male with no significant medical issues who presents for evaluation of abdominal pain and  elevated liver enzymes  #  Elevated liver enzymes  #Abdominal pain   This is likely due to alcohol use disorder with AST more than ALT    Fibrosis 4 Score = .89 (Low risk)        Interpretation for patients with NAFLD          <1.30       -  F0-F1 (Low risk)          1.30-2.67 -  Indeterminate           >2.67      -  F3-F4 (High risk)     Validated for ages 89-65       On exam patient does not have signs of advanced chronic liver disease, no splenomegaly, ascites, spider angiomas, palmar eythema     Patient does not qualify for diagnosis of acute pancreatitis as per revised [place] classification with no CT finding of pancreatitis and lipase less than 2 times upper normal limit.  This back pain appears most likely musculoskeletal in nature as it was positional in character   will obtain baseline viral hepatitis profile, AMA, and autoimmune serologies   #BMI 36       - walking at a brisk pace/biking at moderate intesity 2.5-5 hours per week     - use pedometer/step counter to track activity     - goal to lose 5-10% of initial body weight     - avoid suagry drinks and juices, use zero calorie beverages     - increase water intake     - eat a low carb diet with plenty of  veggies and fruit     - Get sufficient sleep 7-8 hrs nightly     - maitain active lifestyle     - avoid alcohol     - recommend 2-3 cups Coffee daily     - Counsel on lowering cholesterol by having a diet rich in vegetables,          protein (avoid red meats) and good fats(fish, salmon).  #Colon Cancer screening  The patient was counseled regarding the importance of colorectal cancer screening, particularly starting at age 79 due to the rising incidence of colorectal cancer in younger individuals.  Patient  is average risk for colon cancer and can pursue at age 7 t     All questions were answered.      Delynn Pursley Miguel Tamiah Dysart, MD Gastroenterology and Hepatology [facility]   This chart has been completed using [facility] Dictation software, and while attempts have been made to ensure accuracy , certain words and phrases may not be transcribed as intended

## 2023-08-09 ENCOUNTER — Emergency Department (HOSPITAL_COMMUNITY)
Admission: EM | Admit: 2023-08-09 | Discharge: 2023-08-09 | Disposition: A | Discharge status: Home / Self Care | Payer: Self-pay | Attending: Emergency Medicine | Admitting: Emergency Medicine

## 2023-08-09 ENCOUNTER — Encounter (HOSPITAL_COMMUNITY): Payer: Self-pay

## 2023-08-09 ENCOUNTER — Other Ambulatory Visit: Payer: Self-pay

## 2023-08-09 DIAGNOSIS — R1012 Left upper quadrant pain: Secondary | ICD-10-CM | POA: Insufficient documentation

## 2023-08-09 LAB — CBC
HCT: 40.7 % (ref 39.0–52.0)
Hemoglobin: 13.6 g/dL (ref 13.0–17.0)
MCH: 29.9 pg (ref 26.0–34.0)
MCHC: 33.4 g/dL (ref 30.0–36.0)
MCV: 89.5 fL (ref 80.0–100.0)
Platelets: 389 K/uL (ref 150–400)
RBC: 4.55 MIL/uL (ref 4.22–5.81)
RDW: 13.6 % (ref 11.5–15.5)
WBC: 7.3 K/uL (ref 4.0–10.5)
nRBC: 0 % (ref 0.0–0.2)

## 2023-08-09 LAB — URINALYSIS, ROUTINE W REFLEX MICROSCOPIC
Bacteria, UA: NONE SEEN
Bilirubin Urine: NEGATIVE
Glucose, UA: NEGATIVE mg/dL
Hgb urine dipstick: NEGATIVE
Ketones, ur: 5 mg/dL — AB
Leukocytes,Ua: NEGATIVE
Nitrite: NEGATIVE
Protein, ur: 100 mg/dL — AB
Specific Gravity, Urine: 1.027 (ref 1.005–1.030)
pH: 5 (ref 5.0–8.0)

## 2023-08-09 LAB — COMPREHENSIVE METABOLIC PANEL WITH GFR
ALT: 23 U/L (ref 0–44)
AST: 19 U/L (ref 15–41)
Albumin: 4.6 g/dL (ref 3.5–5.0)
Alkaline Phosphatase: 42 U/L (ref 38–126)
Anion gap: 13 (ref 5–15)
BUN: 12 mg/dL (ref 6–20)
CO2: 21 mmol/L — ABNORMAL LOW (ref 22–32)
Calcium: 10.1 mg/dL (ref 8.9–10.3)
Chloride: 106 mmol/L (ref 98–111)
Creatinine, Ser: 1.05 mg/dL (ref 0.61–1.24)
GFR, Estimated: >60 mL/min (ref >60)
Glucose, Bld: 91 mg/dL (ref 70–99)
Potassium: 3.6 mmol/L (ref 3.5–5.1)
Sodium: 140 mmol/L (ref 135–145)
Total Bilirubin: 0.8 mg/dL (ref 0.0–1.2)
Total Protein: 8.1 g/dL (ref 6.5–8.1)

## 2023-08-09 LAB — LIPASE, BLOOD: Lipase: 51 U/L (ref 11–51)

## 2023-08-09 MED ORDER — OMEPRAZOLE 20 MG PO CPDR
20.0000 mg | DELAYED_RELEASE_CAPSULE | Freq: Every day | ORAL | 0 refills | Status: AC
Start: 2023-08-09 — End: ?

## 2023-08-09 NOTE — ED Provider Notes (Signed)
 [place] EMERGENCY DEPARTMENT AT [facility] Provider Note   CSN: [id number] Arrival date & time: 08/09/23  1842     Patient presents with: Abdominal Pain   Miguel Robbins is a 43 y.o. male.    Abdominal Pain Patient abdominal pain.  Upper abdomen.  Has had issues with the same.  Left upper quadrant pain.  Worse after eating.  No nausea or vomiting.  Worsening wounds.  States abdomen feels more full on left side.  Is had for months now.  Has seen GI.  States he got seen in the ER and was diagnosed with pancreatitis.  States he saw GI who said it was his liver.  No fevers.  No weight loss.     Prior to Admission medications   Medication Sig Start Date End Date Taking? Authorizing Provider  omeprazole  (PRILOSEC) 20 MG capsule Take 1 capsule (20 mg total) by mouth daily. 08/09/23  Yes Patsey Lot, MD  atorvastatin (LIPITOR) 10 MG tablet Take 10 mg by mouth daily.  07/05/19  [provider]    Allergies: Penicillins, Bee venom, and Cranberry    Review of Systems  Gastrointestinal:  Positive for abdominal pain.    Updated Vital Signs BP 132/87   Pulse 69   Temp 98.1 F (36.7 C) (Oral)   Resp 15   Ht 5' 7 (1.702 m)   Wt 105.7 kg   SpO2 97%   BMI 36.50 kg/m   Physical Exam Vitals and nursing note reviewed.  HENT:     Head: Atraumatic.  Abdominal:     Tenderness: There is abdominal tenderness.     Comments: Mild left upper quadrant tenderness without rebound or guarding.  No hernia palpated.  Skin:    General: Skin is warm.     Capillary Refill: Capillary refill takes less than 2 seconds.  Neurological:     Mental Status: He is alert and oriented to person, place, and time.     (all labs ordered are listed, but only abnormal results are displayed) Labs Reviewed  COMPREHENSIVE METABOLIC PANEL WITH GFR - Abnormal; Notable for the following components:      Result Value   CO2 21 (*)    All other components within normal limits   URINALYSIS, ROUTINE W REFLEX MICROSCOPIC - Abnormal; Notable for the following components:   APPearance HAZY (*)    Ketones, ur 5 (*)    Protein, ur 100 (*)    All other components within normal limits  LIPASE, BLOOD  CBC    EKG: None  Radiology: No results found.   Procedures   Medications Ordered in the ED - No data to display                                  Medical Decision Making Amount and/or Complexity of Data Reviewed Labs: ordered.  Risk Prescription drug management.   Patient presents with abdominal pain.  History of same.  Has had relatively recent CT scans few months ago.  Has had previous cholecystectomy.  Patient or after eating.  Previously mild elevated LFTs and lipase both normalized.  Other causes such as gastritis considered.  Will start Prilosec.  Will have GI follow-up.  Appears stable for discharge home however.     Final diagnoses:  Left upper quadrant abdominal pain    ED Discharge Orders          Ordered  omeprazole  (PRILOSEC) 20 MG capsule  Daily        08/09/23 2130               Patsey Lot, MD 08/09/23 2146

## 2023-08-09 NOTE — ED Triage Notes (Signed)
 Pt arrived via POV c/o recurrent LUQ abdominal pain. Pt reports pain worse with movement or exerts himself. Pt denies N/V/D.

## 2023-11-19 ENCOUNTER — Other Ambulatory Visit: Payer: Self-pay

## 2023-11-19 ENCOUNTER — Emergency Department (HOSPITAL_COMMUNITY)
Admission: EM | Admit: 2023-11-19 | Discharge: 2023-11-19 | Disposition: A | Discharge status: Home / Self Care | Payer: Self-pay | Attending: Emergency Medicine | Admitting: Emergency Medicine

## 2023-11-19 DIAGNOSIS — R748 Abnormal levels of other serum enzymes: Secondary | ICD-10-CM | POA: Insufficient documentation

## 2023-11-19 DIAGNOSIS — K2970 Gastritis, unspecified, without bleeding: Secondary | ICD-10-CM | POA: Insufficient documentation

## 2023-11-19 DIAGNOSIS — R1012 Left upper quadrant pain: Secondary | ICD-10-CM

## 2023-11-19 DIAGNOSIS — L989 Disorder of the skin and subcutaneous tissue, unspecified: Secondary | ICD-10-CM | POA: Insufficient documentation

## 2023-11-19 LAB — URINALYSIS, ROUTINE W REFLEX MICROSCOPIC
Bacteria, UA: NONE SEEN
Bilirubin Urine: NEGATIVE
Glucose, UA: NEGATIVE mg/dL
Hgb urine dipstick: NEGATIVE
Ketones, ur: NEGATIVE mg/dL
Leukocytes,Ua: NEGATIVE
Nitrite: NEGATIVE
Protein, ur: 100 mg/dL — AB
Specific Gravity, Urine: 1.013 (ref 1.005–1.030)
pH: 6 (ref 5.0–8.0)

## 2023-11-19 LAB — CBC WITH DIFFERENTIAL/PLATELET
Abs Immature Granulocytes: 0.01 K/uL (ref 0.00–0.07)
Basophils Absolute: 0 K/uL (ref 0.0–0.1)
Basophils Relative: 1 %
Eosinophils Absolute: 0.1 K/uL (ref 0.0–0.5)
Eosinophils Relative: 2 %
HCT: 40.2 % (ref 39.0–52.0)
Hemoglobin: 13.3 g/dL (ref 13.0–17.0)
Immature Granulocytes: 0 %
Lymphocytes Relative: 49 %
Lymphs Abs: 2.1 K/uL (ref 0.7–4.0)
MCH: 29.6 pg (ref 26.0–34.0)
MCHC: 33.1 g/dL (ref 30.0–36.0)
MCV: 89.3 fL (ref 80.0–100.0)
Monocytes Absolute: 0.3 K/uL (ref 0.1–1.0)
Monocytes Relative: 7 %
Neutro Abs: 1.7 K/uL (ref 1.7–7.7)
Neutrophils Relative %: 41 %
Platelets: 336 K/uL (ref 150–400)
RBC: 4.5 MIL/uL (ref 4.22–5.81)
RDW: 13 % (ref 11.5–15.5)
WBC: 4.2 K/uL (ref 4.0–10.5)
nRBC: 0 % (ref 0.0–0.2)

## 2023-11-19 LAB — COMPREHENSIVE METABOLIC PANEL WITH GFR
ALT: 21 U/L (ref 0–44)
AST: 19 U/L (ref 15–41)
Albumin: 4.2 g/dL (ref 3.5–5.0)
Alkaline Phosphatase: 46 U/L (ref 38–126)
Anion gap: 12 (ref 5–15)
BUN: 7 mg/dL (ref 6–20)
CO2: 24 mmol/L (ref 22–32)
Calcium: 9.2 mg/dL (ref 8.9–10.3)
Chloride: 106 mmol/L (ref 98–111)
Creatinine, Ser: 0.79 mg/dL (ref 0.61–1.24)
GFR, Estimated: >60 mL/min (ref >60)
Glucose, Bld: 132 mg/dL — ABNORMAL HIGH (ref 70–99)
Potassium: 3.5 mmol/L (ref 3.5–5.1)
Sodium: 142 mmol/L (ref 135–145)
Total Bilirubin: 0.4 mg/dL (ref 0.0–1.2)
Total Protein: 7 g/dL (ref 6.5–8.1)

## 2023-11-19 LAB — LIPASE, BLOOD: Lipase: 133 U/L — ABNORMAL HIGH (ref 11–51)

## 2023-11-19 MED ORDER — PANTOPRAZOLE SODIUM 20 MG PO TBEC: 20.0000 mg | DELAYED_RELEASE_TABLET | Freq: Every day | ORAL | 2 refills | Status: DC

## 2023-11-19 MED ORDER — FAMOTIDINE 20 MG PO TABS: 20.0000 mg | ORAL_TABLET | Freq: Two times a day (BID) | ORAL | 2 refills | Status: AC

## 2023-11-19 MED ORDER — PANTOPRAZOLE SODIUM 40 MG IV SOLR
40.0000 mg | Freq: Once | INTRAVENOUS | Status: AC
  Administered 2023-11-19: 40 mg via INTRAVENOUS
  Filled 2023-11-19: qty 10

## 2023-11-19 NOTE — ED Provider Notes (Signed)
 [place] EMERGENCY DEPARTMENT AT [facility] Provider Note   CSN: [id number] Arrival date & time: 11/19/23  9184     Patient presents with: Flank Pain and Cyst   Miguel Robbins is a 43 y.o. male.   43 year old male who presents to the emergency department with abdominal pain.  Patient reports that he has been having left upper quadrant abdominal pain for several months.  Has been seen in the ED for this 3 times and is seeing GI as an outpatient.  Has had 2 CT scans.  Had a lipase that was elevated in the 80s and minimally elevated LFTs but otherwise reassuring workup.  Was drinking alcohol at that point in time but says that he has not had alcohol in months.  Says that over the past week the pain has been worsening.  It is in his left upper quadrant.  Radiates to his back.  Worse with eating.  Has been trying ibuprofen  for it.  Also has a lesion on his scalp that has been present since 2011.  Was drained in 2023 but is still present and appears to be open to him.  Has had a cholecystectomy and left inguinal hernia repair as a child.       Prior to Admission medications   Medication Sig Start Date End Date Taking? Authorizing Provider  famotidine (PEPCID) 20 MG tablet Take 1 tablet (20 mg total) by mouth 2 (two) times daily. 11/19/23  Yes Yolande Lamar BROCKS, MD  omeprazole  (PRILOSEC) 20 MG capsule Take 1 capsule (20 mg total) by mouth daily. 08/09/23   Patsey Lot, MD  atorvastatin (LIPITOR) 10 MG tablet Take 10 mg by mouth daily.  07/05/19  [provider]    Allergies: Penicillins, Bee venom, and Cranberry    Review of Systems  Updated Vital Signs BP (!) 141/96 (BP Location: Left Arm)   Pulse 61   Temp 98.5 F (36.9 C) (Oral)   Resp 18   Ht 5' 7 (1.702 m)   Wt 105.2 kg   SpO2 100%   BMI 36.34 kg/m   Physical Exam Vitals and nursing note reviewed.  Constitutional:      General: He is not in acute distress.    Appearance: He is well-developed.   HENT:     Head: Normocephalic and atraumatic.     Comments: See images below for scalp lesion    Right Ear: External ear normal.     Left Ear: External ear normal.     Nose: Nose normal.  Eyes:     Extraocular Movements: Extraocular movements intact.     Conjunctiva/sclera: Conjunctivae normal.     Pupils: Pupils are equal, round, and reactive to light.  Abdominal:     General: There is no distension.     Palpations: Abdomen is soft. There is no mass.     Tenderness: There is no abdominal tenderness. There is no guarding.     Comments: No rashes on abdomen and location of pain  Musculoskeletal:     Cervical back: Normal range of motion and neck supple.  Skin:    General: Skin is warm and dry.  Neurological:     Mental Status: He is alert. Mental status is at baseline.  Psychiatric:        Mood and Affect: Mood normal.        Behavior: Behavior normal.    Scalp:   (all labs ordered are listed, but only abnormal results are displayed) Labs  Reviewed  URINALYSIS, ROUTINE W REFLEX MICROSCOPIC - Abnormal; Notable for the following components:      Result Value   Protein, ur 100 (*)    All other components within normal limits  COMPREHENSIVE METABOLIC PANEL WITH GFR - Abnormal; Notable for the following components:   Glucose, Bld 132 (*)    All other components within normal limits  LIPASE, BLOOD - Abnormal; Notable for the following components:   Lipase 133 (*)    All other components within normal limits  CBC WITH DIFFERENTIAL/PLATELET    EKG: None  Radiology: No results found.   Procedures   Medications Ordered in the ED  pantoprazole (PROTONIX) injection 40 mg (40 mg Intravenous Given 11/19/23 0941)                                    Medical Decision Making Amount and/or Complexity of Data Reviewed Labs: ordered.  Risk Prescription drug management.   43 year old male who presents to the emergency department with abdominal pain for several  months  Initial Ddx:  Hernia, kidney stone, pancreatitis, gastritis, cholecystitis, biliary colic  MDM/Course:  Patient presents emergency department with abdominal pain for several months.  Has already had 2 CT scans that did not show any acute abnormality.  Did have a mildly elevated lipase at 1 point in time.  Saw GI who had low concern for pancreatitis.  Did have minimally elevated LFTs at 1 time as well.  Sounds like he used to drink quite a bit of alcohol.  Also uses NSAIDs.  On exam no significant tenderness palpation.  No signs of hernia.  Labs were obtained which again show minimally elevated lipase.  Does not seem like he is having pancreatitis.  Suspect that is more gastritis.  Prescribed Pepcid.  Will have him follow-up with his primary doctor and GI and see if he potentially needs an EGD.  Does also have a lesion on his scalp.  Reached out to dermatology about setting up an appointment to see if he can get biopsied.  Patient was also given their information to set up an appointment  This patient presents to the ED for concern of complaints listed in HPI, this involves an extensive number of treatment options, and is a complaint that carries with it a high risk of complications and morbidity. Disposition including potential need for admission considered.   Dispo: DC Home. Return precautions discussed including, but not limited to, those listed in the AVS. Allowed pt time to ask questions which were answered fully prior to dc.  Additional history obtained from significant other Records reviewed ED Visit Notes The following labs were independently interpreted: Chemistry and show no acute abnormality I have reviewed the patients home medications and made adjustments as needed  Portions of this note were generated with [employer] dictation software. Dictation errors may occur despite best attempts at proofreading.     Final diagnoses:  Left upper quadrant abdominal pain  Gastritis without  bleeding, unspecified chronicity, unspecified gastritis type  Scalp lesion    ED Discharge Orders          Ordered    pantoprazole (PROTONIX) 20 MG tablet  Daily,   Status:  Discontinued        11/19/23 1012    famotidine (PEPCID) 20 MG tablet  2 times daily        11/19/23 1014  Yolande Lamar BROCKS, MD 11/19/23 2101

## 2023-11-19 NOTE — ED Triage Notes (Signed)
 Pt c/o left flank pain when he eats and at other times. Pt states he feels the area is swollen. Pt denies any injury to the area. Pt reports pain x 1 week but has been going on for about 6 months off and on.

## 2023-11-19 NOTE — ED Triage Notes (Signed)
 Pt reports a cyst on his scalp that needs removed.

## 2023-11-19 NOTE — Discharge Instructions (Addendum)
 You were seen for your abdominal pain in the emergency department. It is likely from stomach irritation (gastritis). Please refrain from alcohol or NSAIDs as it can make your pain worse.   At home, please take the pepcid we have prescribed you.    Check your MyChart online for the results of any tests that had not resulted by the time you left the emergency department.   Follow-up with your primary doctor in 2-3 days regarding your visit and pain. Follow-up with your GI doctor and dermatology as well.   Return immediately to the emergency department if you experience any of the following: worsening pain, fevers, vomiting, or any other concerning symptoms.    Thank you for visiting our Emergency Department. It was a pleasure taking care of you today.

## 2023-12-13 ENCOUNTER — Ambulatory Visit: Payer: Self-pay

## 2023-12-13 NOTE — Telephone Encounter (Signed)
 FYI Only or Action Required?: FYI only for provider: Referred to UC for current symptoms.  Patient was last seen in primary care on New Patient.  Called Nurse Triage reporting Gastroesophageal Reflux.  Symptoms began x 1 year ago.  Interventions attempted: Rest, hydration, or home remedies.  Symptoms are: unchanged.  Triage Disposition: Call PCP Within 24 Hours  Patient/caregiver understands and will follow disposition?: Yes         Copied from CRM [id number]. Topic: Clinical - Red Word Triage >> Dec 13, 2023 12:17 PM Victoria B wrote: Patient's wife called in patient has severe pain on left side, says from acid reflux Reason for Disposition  [1] Patient says chest pain feels exactly the same as previously diagnosed heartburn AND [2] describes burning in chest AND [3] accompanying sour taste in mouth  Answer Assessment - Initial Assessment Questions 1. LOCATION: Where does it hurt?       Rib cage area left sided   2. RADIATION: Does the pain go anywhere else? (e.g., into neck, jaw, arms, back)     Back   3. ONSET: When did the chest pain begin? (Minutes, hours or days)      X 1 year intermittently   4. PATTERN: Does the pain come and go, or has it been constant since it started?  Does it get worse with exertion?      Intermittently   5. DURATION: How long does it last (e.g., seconds, minutes, hours)     Unsure   6. SEVERITY: How bad is the pain?  (e.g., Scale 1-10; mild, moderate, or severe)     Pt. Is not currently with wife who is calling   7. CARDIAC RISK FACTORS: Do you have any history of heart problems or risk factors for heart disease? (e.g., angina, prior heart attack; diabetes, high blood pressure, high cholesterol, smoker, or strong family history of heart disease)     No   8. PULMONARY RISK FACTORS: Do you have any history of lung disease?  (e.g., blood clots in lung, asthma, emphysema, birth control pills)     No   9. CAUSE: What do  you think is causing the chest pain?     Acid reflux   10. OTHER SYMPTOMS: Do you have any other symptoms? (e.g., dizziness, nausea, vomiting, sweating, fever, difficulty breathing, cough)  No .   Patient is currently on prescription medication for acid reflux; wife cannot remember the name of the medication. NP. Appointment scheduled for 3/11, referred to UC for current symptoms. Patient agrees with plan of care, and will call back if anything changes, or if symptoms worsen.  Protocols used: Chest Pain-A-AH

## 2023-12-21 ENCOUNTER — Encounter (HOSPITAL_COMMUNITY): Payer: Self-pay | Admitting: *Deleted

## 2023-12-21 ENCOUNTER — Other Ambulatory Visit: Payer: Self-pay

## 2023-12-21 ENCOUNTER — Emergency Department (HOSPITAL_COMMUNITY)
Admission: EM | Admit: 2023-12-21 | Discharge: 2023-12-21 | Disposition: A | Discharge status: Home / Self Care | Attending: Emergency Medicine | Admitting: Emergency Medicine

## 2023-12-21 DIAGNOSIS — K047 Periapical abscess without sinus: Secondary | ICD-10-CM | POA: Insufficient documentation

## 2023-12-21 DIAGNOSIS — K0889 Other specified disorders of teeth and supporting structures: Secondary | ICD-10-CM | POA: Diagnosis present

## 2023-12-21 MED ORDER — CLINDAMYCIN HCL 150 MG PO CAPS
300.0000 mg | ORAL_CAPSULE | Freq: Once | ORAL | Status: AC
  Administered 2023-12-21: 300 mg via ORAL
  Filled 2023-12-21: qty 2

## 2023-12-21 MED ORDER — HYDROCODONE-ACETAMINOPHEN 5-325 MG PO TABS
1.0000 | ORAL_TABLET | Freq: Once | ORAL | Status: AC
  Administered 2023-12-21: 1 via ORAL
  Filled 2023-12-21: qty 1

## 2023-12-21 MED ORDER — CLINDAMYCIN HCL 300 MG PO CAPS: 300.0000 mg | ORAL_CAPSULE | Freq: Three times a day (TID) | ORAL | 0 refills | Status: AC

## 2023-12-21 MED ORDER — HYDROCODONE-ACETAMINOPHEN 5-325 MG PO TABS: 1.0000 | ORAL_TABLET | Freq: Four times a day (QID) | ORAL | 0 refills | Status: AC | PRN

## 2023-12-21 NOTE — Discharge Instructions (Signed)
 Take the antibiotic as prescribed for the dental infection. You can take Norco for pain as directed. When the infection comes under control, you can take Tylenol  1000 mg every 6 hours for pain (maximum 4000 mg in a 24 hour period).   Follow up with dentistry as planned. Return to the ED with any severe pain, high fever, difficulty swallowing or for new concern.

## 2023-12-21 NOTE — ED Triage Notes (Signed)
 Pt with dental pain since Sunday to left upper. Tried to make an appt with dentist but not able to be seen til March.

## 2023-12-21 NOTE — ED Provider Notes (Signed)
 [place] EMERGENCY DEPARTMENT AT [facility] Provider Note   CSN: [id number] Arrival date & time: 12/21/23  1139     Patient presents with: Dental Pain   Miguel Robbins is a 43 y.o. male.   Patient to ED with left upper dental pain that started 4 days ago and include swelling to hard palette and lateral molar. No drainage. No facial swelling or fever. He reports trying aspirin for pain while taking Pepcid with h/o reflux but has vomited when taking it. No nausea or vomiting otherwise.   The history is provided by the patient. No language interpreter was used.  Dental Pain      Prior to Admission medications   Medication Sig Start Date End Date Taking? Authorizing Provider  clindamycin  (CLEOCIN ) 300 MG capsule Take 1 capsule (300 mg total) by mouth 3 (three) times daily. 12/21/23  Yes Brittainy Bucker, Margit, PA-C  HYDROcodone -acetaminophen  (NORCO/VICODIN) 5-325 MG tablet Take 1 tablet by mouth every 6 (six) hours as needed for severe pain (pain score 7-10) or moderate pain (pain score 4-6). 12/21/23  Yes Dashia Caldeira, Margit, PA-C  famotidine (PEPCID) 20 MG tablet Take 1 tablet (20 mg total) by mouth 2 (two) times daily. 11/19/23   Yolande Lamar BROCKS, MD  omeprazole  (PRILOSEC) 20 MG capsule Take 1 capsule (20 mg total) by mouth daily. 08/09/23   Patsey Lot, MD  atorvastatin (LIPITOR) 10 MG tablet Take 10 mg by mouth daily.  07/05/19  [provider]    Allergies: Penicillins, Bee venom, and Cranberry    Review of Systems  Updated Vital Signs BP (!) 138/97 (BP Location: Right Arm)   Pulse 71   Temp 97.7 F (36.5 C)   Resp 17   Ht 5' 7 (1.702 m)   Wt 104.3 kg   SpO2 97%   BMI 36.02 kg/m   Physical Exam Constitutional:      Appearance: He is well-developed.  HENT:     Mouth/Throat:     Comments: No left sided facial swelling. There are areas of swelling to medial and lateral apices of left upper rear molar. No active drainage or bleeding. Generally good  dentition without widespread decay. Pulmonary:     Effort: Pulmonary effort is normal.  Musculoskeletal:        General: Normal range of motion.     Cervical back: Normal range of motion.  Lymphadenopathy:     Cervical: No cervical adenopathy.  Skin:    General: Skin is warm and dry.  Neurological:     Mental Status: He is alert and oriented to person, place, and time.     (all labs ordered are listed, but only abnormal results are displayed) Labs Reviewed - No data to display  EKG: None  Radiology: No results found.   Procedures   Medications Ordered in the ED  clindamycin  (CLEOCIN ) capsule 300 mg (has no administration in time range)  HYDROcodone -acetaminophen  (NORCO/VICODIN) 5-325 MG per tablet 1 tablet (has no administration in time range)    Clinical Course as of 12/21/23 1420  Tue Dec 21, 2023  1411 Patient with dental abscess left upper rear molar. No facial swelling or fever. Oropharynx is benign. Will start clindamycin  (PCN allergic). He has scheduled dental follow up but first available is not for months. Discussed return precautions. PDMD reviewed and found to be score of 000. [SU]    Clinical Course User Index [SU] Odell Margit, PA-C  Medical Decision Making       Final diagnoses:  Dental abscess    ED Discharge Orders          Ordered    clindamycin  (CLEOCIN ) 300 MG capsule  3 times daily        12/21/23 1415    HYDROcodone -acetaminophen  (NORCO/VICODIN) 5-325 MG tablet  Every 6 hours PRN        12/21/23 1416               Odell Balls, PA-C 12/21/23 1420    Melvenia Motto, MD 12/21/23 1556

## 2024-04-12 ENCOUNTER — Ambulatory Visit: Admitting: Physician Assistant

## 2024-04-13 ENCOUNTER — Ambulatory Visit: Admitting: Physician Assistant

## 2024-07-03 ENCOUNTER — Ambulatory Visit: Payer: Self-pay | Admitting: Dermatology
